# Patient Record
Sex: Male | Born: 1960 | Race: White | Hispanic: No | Marital: Married | Smoking: Never smoker
Health system: Southern US, Community
[De-identification: ages and names within clinical notes are randomized; demographics above are authoritative.]

## PROBLEM LIST (undated history)

## (undated) DIAGNOSIS — I10 Essential (primary) hypertension: Secondary | ICD-10-CM

## (undated) DIAGNOSIS — E119 Type 2 diabetes mellitus without complications: Secondary | ICD-10-CM

---

## 2006-01-02 ENCOUNTER — Ambulatory Visit: Payer: Self-pay | Admitting: Internal Medicine

## 2006-09-30 ENCOUNTER — Ambulatory Visit: Payer: Self-pay | Admitting: Otolaryngology

## 2007-12-11 ENCOUNTER — Ambulatory Visit (HOSPITAL_COMMUNITY): Admission: RE | Admit: 2007-12-11 | Discharge: 2007-12-11 | Payer: Self-pay | Admitting: Neurosurgery

## 2008-01-13 ENCOUNTER — Inpatient Hospital Stay (HOSPITAL_COMMUNITY): Admission: RE | Admit: 2008-01-13 | Discharge: 2008-01-15 | Payer: Self-pay | Admitting: Neurosurgery

## 2008-03-15 ENCOUNTER — Ambulatory Visit: Payer: Self-pay | Admitting: Neurosurgery

## 2010-06-19 NOTE — Op Note (Signed)
Brandon Sweeney, Brandon Sweeney NO.:  [id number]   MEDICAL RECORD NO.:  [id number]          PATIENT TYPE:  INP   LOCATION:  3032                         FACILITY:  [facility]   PHYSICIAN:  Hilda Lias, M.D.   DATE OF BIRTH:  1960-02-24   DATE OF PROCEDURE:  01/13/2008  DATE OF DISCHARGE:                               OPERATIVE REPORT   PREOPERATIVE DIAGNOSIS:  Cervical spondylosis with chronic  radiculopathy, C4-5, C5-6, and C6-7.   POSTOPERATIVE DIAGNOSIS:  Cervical spondylosis with chronic  radiculopathy, C4-5, C5-6, and C6-7.   PROCEDURE:  Anterior C4-5, C5-6, and C6-7 diskectomy, decompression of  spinal cord, bilateral foraminotomy, interbody fusion with auto and  allograft plate, and microscope.   SURGEON:  Hilda Lias, MD   CLINICAL HISTORY:  The patient is a 50 year old gentleman who had been  followed for many years complaining of neck pain with radiation to both  upper extremities, left worse than right one.  The patient came to my  office telling me that he is getting worse.  He has burning pain and  weakness in the deltoid, biceps, and triceps.  X-ray and myelogram  showed severe degenerative disk disease at the level of C4-5, C5-6, and  C6-7.  Surgery was advised.   PROCEDURE:  The patient was taken to the OR and after intubation, the  neck was cleaned with DuraPrep.  A transverse incision was made through  the skin, subcutaneous tissue, and platysma down to cervical area.  X-  rays showed that the needle was at the level of C4-5.  Immediately, we  found that he has a large osteophyte at the level of C4-5. C5-6, and C6-  7.  With the Leksell, we removed the osteophyte.  With __________  incision at the level of C4-5 with the anterior ligament was opened.  The patient has no space and we had to work our way down to the  posterior vertebral bodies using the microcurettes and drill.  Then, the  posterior ligament was opened.  Using the 1, 2, and 3-mm  Kerrison punch,  we decompressed the spinal cord as well as the foramen.  Both foramen  were quite narrow.  At the level of C5-6, it was the same finding.  The  disk was removed using the drill and the microcurette.  There was almost  no space between the posterior ligament and the dura mater.  At the end,  we worked our way and we found the space.  The patient had severe  foraminal stenosis and probably this was the worst level, left worse  than right one.  At the end, good decompression of C4 through C6 nerve  root was done.  At the level C6-7, we found the same finding with  decompression of spinal cord and the foramen.  Then, the endplates were  drilled.  Three allograft with autograft inside as well as DBX of 8 mm,  lordotic were inserted at those 3 levels.  This was followed with a  plate using 4 screws.  Lateral cervical spine showed that the plate was  in  good position at level C4-5 and from then below we were unable to see  it because of the shoulder.  Nevertheless, looking at the plate, we are  in good location.  Although, we achieved hemostasis, nevertheless, we  left a drain.  The wound was closed with Vicryl and Steri-Strips.           ______________________________  Hilda Lias, M.D.     EB/MEDQ  D:  01/13/2008  T:  01/14/2008  Job:  [id number]

## 2010-06-19 NOTE — Op Note (Signed)
Brandon Sweeney, EWING NO.:  [id number]   MEDICAL RECORD NO.:  [id number]          PATIENT TYPE:  INP   LOCATION:  3032                         FACILITY:  [facility]   PHYSICIAN:  Hilda Lias, M.D.   DATE OF BIRTH:  10/25/1960   DATE OF PROCEDURE:  01/13/2008  DATE OF DISCHARGE:                               OPERATIVE REPORT   PREOPERATIVE DIAGNOSIS:  C4-5, 5-6, 6-7 spondylosis with chronic  radiculopathy.   POSTOPERATIVE DIAGNOSIS:  C4-5, 5-6, 6-7 spondylosis with chronic  radiculopathy.   PROCEDURE:  Anterior 4-5, 5-6, 6-7 decompression of the spinal cord,  interbody fusion with allograft and DBX.  Plate, microscope.   SURGEON:  Hilda Lias, MD   CLINICAL HISTORY:  The patient is a 50 year old gentleman who had been  seen by me for more than 4 years complaining of neck pain with radiation  to the both upper extremities, left worse than right one.  X-ray and  myelogram showed severe spondylosis at the 4-5, 5-6, 6-7.  The patient  wanted to go ahead with surgery.  The risks were explained to him in the  history and physical.   PROCEDURE:  The patient was taken to the OR, and after intubation, the  left neck was cleaned with DuraPrep.  Transverse incision through the  skin, platysma was done.  We found 3 large osteophytes.  X-rays showed  that we were at the level of 4-5.  From then on, we removed the large  osteophyte at 4-5, 5-6, 6-7.  We started our dissection at the level of  4-5 where there was almost no space.  We worked our way posteriorly  using the microcurette and the drill.  Then, the posterior ligament was  opened.  Decompression of both C5 nerve roots was achieved.  At the  level of 5-6, we find the same findings.  Using the drill and the  microcurette to remove the disk, which was quite degenerative.  The  posterior ligament was quite thin and adherent to the dura mater.  Dissection was done, and at the end, we were able to decompress the  spinal cord in both C6 nerve roots.  This was probably the worse space.  At the level C6-C7, we find the same finding.  We then removal of that  degenerative disk and decompression of the spinal cord as well as the  nerve root.  Then, the endplate was drilled.  Three allograft of 8 mm  with autograft and DBX inside was used to fill out the space.  This was  followed by a plate using screws.  Lateral cervical spine showed good  position of the plate at the level of L4-5, and from below, it was  difficult to see because of the shoulder.  Nevertheless, the area on the  visualization showed normal position of the plate and the graft.  Although we achieved hemostasis, we left a drain in the precervical  area.  The wound was closed with Vicryl and Steri-Strips.           ______________________________  Hilda Lias, M.D.  EB/MEDQ  D:  01/13/2008  T:  01/14/2008  Job:  [id number]

## 2010-06-19 NOTE — H&P (Signed)
[name], Brandon Sweeney NO.:  [id number]   MEDICAL RECORD NO.:  [id number]          PATIENT TYPE:  INP   LOCATION:  3032                         FACILITY:  [facility]   PHYSICIAN:  Hilda Lias, M.D.   DATE OF BIRTH:  [birth year]   DATE OF ADMISSION:  01/13/2008  DATE OF DISCHARGE:                              HISTORY & PHYSICAL   HISTORY:  Mr. Brandon Sweeney is a gentleman who had been seen by me since 2006  complaining of neck pain radiating to the shoulder with a tingling  sensation going to the left upper extremity.  Five years ago, I made the  diagnosis of clinical cervical spondylosis.  He came back to see me  about a month ago complaining of worsening pain with pain mostly going  to the left upper extremity.  He has failed and he is not getting any  better.  We went ahead and we did a cervical myelogram which showed  spondylosis at C4-C5, C5-C6, and C6-C7.  Because of the finding, he  wants to proceed with surgery.   PAST MEDICAL HISTORY:  Negative.   ALLERGIES:  He is not allergic to medication.   SOCIAL HISTORY:  Negative.   FAMILY HISTORY:  I did surgery on his brother many years ago.   REVIEW OF SYSTEMS:  Positive for neck and left shoulder pain.   PHYSICAL EXAMINATION:  HEAD, EARS, NOSE, AND THROAT:  Normal.  NECK:  He has some limitation of movement secondary to pain.  LUNGS:  Clear.  HEART:  Heart sounds normal.  ABDOMEN:  Normal.  EXTREMITIES:  Normal pulses.  NEUROLOGIC:  He has some weakness of the biceps and deltoid.  Reflexes  are symmetrical.  He has and numbness which involves mostly the index  and middle finger.   Cervical spine x-ray shows spondylosis at C4-C5, C5-C6, and C6-C7 with  foraminal narrowing.   CLINICAL IMPRESSION:  Cervical spondylosis, C4-C5, C5-C6, and C6-C7 with  chronic radiculopathy.   RECOMMENDATIONS:  The patient is being admitted for surgical.  Procedure  will be a three-level anterior cervical diskectomy with bone graft  and  plate.  The patient knows about the risks of infection, CSF leak,  worsening pain, paralysis, no improvement whatsoever, and need for  another surgery.          ______________________________  Hilda Lias, M.D.    EB/MEDQ  D:  01/13/2008  T:  01/13/2008  Job:  [id number]

## 2010-11-09 LAB — CBC: MCV: 90 fL (ref 78.0–100.0)

## 2012-03-27 ENCOUNTER — Ambulatory Visit: Payer: Self-pay | Admitting: Unknown Physician Specialty

## 2016-06-20 ENCOUNTER — Encounter
Admission: EM | Disposition: A | Discharge status: Home / Self Care | Payer: Self-pay | Source: Home / Self Care | Attending: Emergency Medicine

## 2016-06-20 ENCOUNTER — Emergency Department
Admission: EM | Admit: 2016-06-20 | Discharge: 2016-06-20 | Disposition: A | Discharge status: Home / Self Care | Payer: BLUE CROSS/BLUE SHIELD | Attending: Emergency Medicine | Admitting: Emergency Medicine

## 2016-06-20 ENCOUNTER — Emergency Department: Payer: BLUE CROSS/BLUE SHIELD | Admitting: Certified Registered Nurse Anesthetist

## 2016-06-20 ENCOUNTER — Encounter: Payer: Self-pay | Admitting: Emergency Medicine

## 2016-06-20 ENCOUNTER — Emergency Department: Payer: BLUE CROSS/BLUE SHIELD

## 2016-06-20 DIAGNOSIS — S1184XA Puncture wound with foreign body of other specified part of neck, initial encounter: Secondary | ICD-10-CM | POA: Insufficient documentation

## 2016-06-20 DIAGNOSIS — I10 Essential (primary) hypertension: Secondary | ICD-10-CM | POA: Insufficient documentation

## 2016-06-20 DIAGNOSIS — Y929 Unspecified place or not applicable: Secondary | ICD-10-CM | POA: Diagnosis not present

## 2016-06-20 DIAGNOSIS — E119 Type 2 diabetes mellitus without complications: Secondary | ICD-10-CM | POA: Insufficient documentation

## 2016-06-20 DIAGNOSIS — W320XXA Accidental handgun discharge, initial encounter: Secondary | ICD-10-CM | POA: Insufficient documentation

## 2016-06-20 DIAGNOSIS — S1980XA Other specified injuries of unspecified part of neck, initial encounter: Secondary | ICD-10-CM | POA: Diagnosis present

## 2016-06-20 DIAGNOSIS — W3400XA Accidental discharge from unspecified firearms or gun, initial encounter: Secondary | ICD-10-CM

## 2016-06-20 HISTORY — DX: Essential (primary) hypertension: I10

## 2016-06-20 HISTORY — PX: EXCISION MASS NECK: SHX6703

## 2016-06-20 HISTORY — DX: Type 2 diabetes mellitus without complications: E11.9

## 2016-06-20 LAB — CBC WITH DIFFERENTIAL/PLATELET
Basophils Absolute: 0 10*3/uL (ref 0–0.1)
Basophils Relative: 1 %
EOS ABS: 0.1 10*3/uL (ref 0–0.7)
Eosinophils Relative: 1 %
HEMATOCRIT: 44.3 % (ref 40.0–52.0)
HEMOGLOBIN: 15.2 g/dL (ref 13.0–18.0)
LYMPHS ABS: 1.3 10*3/uL (ref 1.0–3.6)
Lymphocytes Relative: 21 %
MCH: 30 pg (ref 26.0–34.0)
MCHC: 34.4 g/dL (ref 32.0–36.0)
MCV: 87.3 fL (ref 80.0–100.0)
MONOS PCT: 9 %
Monocytes Absolute: 0.6 10*3/uL (ref 0.2–1.0)
NEUTROS PCT: 68 %
Neutro Abs: 4.4 10*3/uL (ref 1.4–6.5)
Platelets: 207 10*3/uL (ref 150–440)
RBC: 5.07 MIL/uL (ref 4.40–5.90)
RDW: 12.8 % (ref 11.5–14.5)
WBC: 6.4 10*3/uL (ref 3.8–10.6)

## 2016-06-20 LAB — BASIC METABOLIC PANEL
ANION GAP: 8 (ref 5–15)
BUN: 20 mg/dL (ref 6–20)
CO2: 26 mmol/L (ref 22–32)
Calcium: 8.9 mg/dL (ref 8.9–10.3)
Chloride: 101 mmol/L (ref 101–111)
Creatinine, Ser: 0.89 mg/dL (ref 0.61–1.24)
Glucose, Bld: 142 mg/dL — ABNORMAL HIGH (ref 65–99)
POTASSIUM: 3.4 mmol/L — AB (ref 3.5–5.1)
SODIUM: 135 mmol/L (ref 135–145)

## 2016-06-20 LAB — TYPE AND SCREEN
ABO/RH(D): A POS
Antibody Screen: NEGATIVE

## 2016-06-20 LAB — GLUCOSE, CAPILLARY: Glucose-Capillary: 136 mg/dL — ABNORMAL HIGH (ref 65–99)

## 2016-06-20 LAB — PROTIME-INR
INR: 0.96
Prothrombin Time: 12.8 seconds (ref 11.4–15.2)

## 2016-06-20 SURGERY — EXCISION, MASS, NECK
Anesthesia: General

## 2016-06-20 MED ORDER — BACITRACIN 500 UNIT/GM EX OINT
TOPICAL_OINTMENT | CUTANEOUS | Status: DC | PRN
  Administered 2016-06-20: 1 via TOPICAL

## 2016-06-20 MED ORDER — FENTANYL CITRATE (PF) 100 MCG/2ML IJ SOLN
INTRAMUSCULAR | Status: AC
  Filled 2016-06-20: qty 2

## 2016-06-20 MED ORDER — FENTANYL CITRATE (PF) 100 MCG/2ML IJ SOLN
INTRAMUSCULAR | Status: DC | PRN
  Administered 2016-06-20 (×2): 50 ug via INTRAVENOUS

## 2016-06-20 MED ORDER — PHENYLEPHRINE HCL 10 MG/ML IJ SOLN
INTRAMUSCULAR | Status: DC | PRN
  Administered 2016-06-20 (×3): 100 ug via INTRAVENOUS

## 2016-06-20 MED ORDER — GLYCOPYRROLATE 0.2 MG/ML IJ SOLN
INTRAMUSCULAR | Status: DC | PRN
  Administered 2016-06-20: 0.2 mg via INTRAVENOUS

## 2016-06-20 MED ORDER — OXYCODONE-ACETAMINOPHEN 5-325 MG PO TABS
1.0000 | ORAL_TABLET | Freq: Once | ORAL | Status: AC
  Administered 2016-06-20: 1 via ORAL
  Filled 2016-06-20: qty 1

## 2016-06-20 MED ORDER — MIDAZOLAM HCL 2 MG/2ML IJ SOLN
INTRAMUSCULAR | Status: AC
  Filled 2016-06-20: qty 2

## 2016-06-20 MED ORDER — CEFAZOLIN SODIUM-DEXTROSE 1-4 GM/50ML-% IV SOLN
1.0000 g | Freq: Once | INTRAVENOUS | Status: AC
  Administered 2016-06-20: 1 g via INTRAVENOUS
  Filled 2016-06-20: qty 50

## 2016-06-20 MED ORDER — FENTANYL CITRATE (PF) 100 MCG/2ML IJ SOLN: 25.0000 ug | INTRAMUSCULAR | Status: DC | PRN

## 2016-06-20 MED ORDER — ONDANSETRON HCL 4 MG/2ML IJ SOLN: 4.0000 mg | Freq: Once | INTRAMUSCULAR | Status: DC | PRN

## 2016-06-20 MED ORDER — ONDANSETRON HCL 4 MG/2ML IJ SOLN
INTRAMUSCULAR | Status: DC | PRN
  Administered 2016-06-20: 4 mg via INTRAVENOUS

## 2016-06-20 MED ORDER — ACETAMINOPHEN 10 MG/ML IV SOLN
INTRAVENOUS | Status: AC
  Filled 2016-06-20: qty 100

## 2016-06-20 MED ORDER — DEXAMETHASONE SODIUM PHOSPHATE 10 MG/ML IJ SOLN
INTRAMUSCULAR | Status: DC | PRN
  Administered 2016-06-20: 10 mg via INTRAVENOUS

## 2016-06-20 MED ORDER — SUCCINYLCHOLINE CHLORIDE 20 MG/ML IJ SOLN
INTRAMUSCULAR | Status: DC | PRN
  Administered 2016-06-20: 130 mg via INTRAVENOUS

## 2016-06-20 MED ORDER — GENTAMICIN SULFATE 0.1 % EX OINT: 1.0000 "application " | TOPICAL_OINTMENT | Freq: Three times a day (TID) | CUTANEOUS | 0 refills | Status: AC

## 2016-06-20 MED ORDER — PROPOFOL 10 MG/ML IV BOLUS
INTRAVENOUS | Status: DC | PRN
  Administered 2016-06-20: 50 mg via INTRAVENOUS
  Administered 2016-06-20: 150 mg via INTRAVENOUS

## 2016-06-20 MED ORDER — IOPAMIDOL (ISOVUE-370) INJECTION 76%
75.0000 mL | Freq: Once | INTRAVENOUS | Status: AC | PRN
  Administered 2016-06-20: 75 mL via INTRAVENOUS

## 2016-06-20 MED ORDER — MIDAZOLAM HCL 2 MG/2ML IJ SOLN
INTRAMUSCULAR | Status: DC | PRN
  Administered 2016-06-20: 2 mg via INTRAVENOUS

## 2016-06-20 MED ORDER — LIDOCAINE-EPINEPHRINE 1 %-1:100000 IJ SOLN
INTRAMUSCULAR | Status: DC | PRN
  Administered 2016-06-20: 7.5 mL

## 2016-06-20 MED ORDER — EPHEDRINE SULFATE 50 MG/ML IJ SOLN
INTRAMUSCULAR | Status: DC | PRN
  Administered 2016-06-20: 10 mg via INTRAVENOUS

## 2016-06-20 MED ORDER — PROPOFOL 10 MG/ML IV BOLUS
INTRAVENOUS | Status: AC
  Filled 2016-06-20: qty 20

## 2016-06-20 MED ORDER — LACTATED RINGERS IV SOLN
INTRAVENOUS | Status: DC | PRN
  Administered 2016-06-20: 18:00:00 via INTRAVENOUS

## 2016-06-20 MED ORDER — ACETAMINOPHEN 10 MG/ML IV SOLN
INTRAVENOUS | Status: DC | PRN
  Administered 2016-06-20: 1000 mg via INTRAVENOUS

## 2016-06-20 MED ORDER — LIDOCAINE HCL (PF) 2 % IJ SOLN
INTRAMUSCULAR | Status: AC
  Filled 2016-06-20: qty 2

## 2016-06-20 MED ORDER — AMOXICILLIN-POT CLAVULANATE 875-125 MG PO TABS: 1.0000 | ORAL_TABLET | Freq: Two times a day (BID) | ORAL | 0 refills | Status: AC

## 2016-06-20 MED ORDER — OXYCODONE-ACETAMINOPHEN 5-325 MG PO TABS: 1.0000 | ORAL_TABLET | ORAL | 0 refills | Status: AC | PRN

## 2016-06-20 MED ORDER — ONDANSETRON HCL 4 MG PO TABS: 4.0000 mg | ORAL_TABLET | Freq: Three times a day (TID) | ORAL | 0 refills | Status: AC | PRN

## 2016-06-20 MED ORDER — LIDOCAINE HCL (CARDIAC) 20 MG/ML IV SOLN
INTRAVENOUS | Status: DC | PRN
  Administered 2016-06-20: 100 mg via INTRAVENOUS

## 2016-06-20 MED ORDER — LABETALOL HCL 5 MG/ML IV SOLN
INTRAVENOUS | Status: DC | PRN
  Administered 2016-06-20: 5 mg via INTRAVENOUS

## 2016-06-20 SURGICAL SUPPLY — 37 items
BLADE SURG 15 STRL LF DISP TIS (BLADE) ×1 IMPLANT
BLADE SURG 15 STRL SS (BLADE) ×2
CANISTER SUCT 1200ML W/VALVE (MISCELLANEOUS) ×3 IMPLANT
CLOSURE WOUND 1/4X4 (GAUZE/BANDAGES/DRESSINGS)
CORD BIP STRL DISP 12FT (MISCELLANEOUS) ×3 IMPLANT
DERMABOND ADVANCED (GAUZE/BANDAGES/DRESSINGS)
DERMABOND ADVANCED .7 DNX12 (GAUZE/BANDAGES/DRESSINGS) IMPLANT
DRAPE MAG INST 16X20 L/F (DRAPES) ×3 IMPLANT
DRSG TEGADERM 2-3/8X2-3/4 SM (GAUZE/BANDAGES/DRESSINGS) ×3 IMPLANT
ELECT NEEDLE 20X.3 GREEN (MISCELLANEOUS) ×3
ELECT REM PT RETURN 9FT ADLT (ELECTROSURGICAL) ×3
ELECTRODE NEEDLE 20X.3 GREEN (MISCELLANEOUS) ×1 IMPLANT
ELECTRODE REM PT RTRN 9FT ADLT (ELECTROSURGICAL) ×1 IMPLANT
FORCEPS JEWEL BIP 4-3/4 STR (INSTRUMENTS) ×3 IMPLANT
GLOVE BIO SURGEON STRL SZ7.5 (GLOVE) ×6 IMPLANT
GOWN STRL REUS W/ TWL LRG LVL3 (GOWN DISPOSABLE) ×3 IMPLANT
GOWN STRL REUS W/TWL LRG LVL3 (GOWN DISPOSABLE) ×6
HOOK STAY BLUNT/RETRACTOR 5M (MISCELLANEOUS) ×3 IMPLANT
JACKSON PRATT 10 (INSTRUMENTS) IMPLANT
JACKSON PRATT 7MM (INSTRUMENTS) ×3 IMPLANT
KIT RM TURNOVER STRD PROC AR (KITS) ×3 IMPLANT
LABEL OR SOLS (LABEL) ×3 IMPLANT
NS IRRIG 500ML POUR BTL (IV SOLUTION) ×3 IMPLANT
PACK HEAD/NECK (MISCELLANEOUS) ×3 IMPLANT
PROBE MONO 100X0.75 ELECT 1.9M (MISCELLANEOUS) ×3 IMPLANT
SHEARS HARMONIC 9CM CVD (BLADE) ×3 IMPLANT
SPONGE KITTNER 5P (MISCELLANEOUS) ×3 IMPLANT
SPONGE LAP 18X18 5 PK (GAUZE/BANDAGES/DRESSINGS) IMPLANT
SPONGE XRAY 4X4 16PLY STRL (MISCELLANEOUS) IMPLANT
STRIP CLOSURE SKIN 1/4X4 (GAUZE/BANDAGES/DRESSINGS) IMPLANT
SUT PROLENE 6 0 P 1 18 (SUTURE) IMPLANT
SUT SILK 2 0 (SUTURE) ×2
SUT SILK 2 0 SH (SUTURE) ×3 IMPLANT
SUT SILK 2 0SH CR/8 30 (SUTURE) IMPLANT
SUT SILK 2-0 18XBRD TIE 12 (SUTURE) ×1 IMPLANT
SUT VIC AB 4-0 RB1 18 (SUTURE) ×3 IMPLANT
SYR 3ML LL SCALE MARK (SYRINGE) ×3 IMPLANT

## 2016-06-20 NOTE — Anesthesia Post-op Follow-up Note (Cosign Needed)
Anesthesia QCDR form completed.        
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
## 2016-06-20 NOTE — Transfer of Care (Signed)
Immediate Anesthesia Transfer of Care Note  Patient: Brandon Sweeney  Procedure(s) Performed: Procedure(s): EXCISION MASS NECK (N/A)  Patient Location: PACU  Anesthesia Type:General  Level of Consciousness: awake and alert   Airway & Oxygen Therapy: Patient Spontanous Breathing and Patient connected to face mask oxygen  Post-op Assessment: Report given to RN and Post -op Vital signs reviewed and stable  Post vital signs: Reviewed and stable  Last Vitals:  Vitals:   06/20/16 1645 06/20/16 1654  BP: (!) 166/104   Pulse: 94 94  Resp: 16 17  Temp:      Last Pain:  Vitals:   06/20/16 1453  TempSrc: Oral  PainSc: 8          Complications: No apparent anesthesia complications
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
## 2016-06-20 NOTE — Anesthesia Procedure Notes (Signed)
Procedure Name: Intubation Performed by: Demetrius Charity Pre-anesthesia Checklist: Patient identified, Patient being monitored, Timeout performed, Emergency Drugs available and Suction available Patient Re-evaluated:Patient Re-evaluated prior to inductionOxygen Delivery Method: Circle system utilized Preoxygenation: Pre-oxygenation with 100% oxygen Intubation Type: IV induction and Rapid sequence Laryngoscope Size: Glidescope and 4 Grade View: Grade II Tube type: Oral Tube size: 7.5 mm Number of attempts: 1 Airway Equipment and Method: Video-laryngoscopy and Rigid stylet Placement Confirmation: ETT inserted through vocal cords under direct vision,  positive ETCO2 and breath sounds checked- equal and bilateral Secured at: 24 cm Tube secured with: Tape Dental Injury: Teeth and Oropharynx as per pre-operative assessment

## 2016-06-20 NOTE — Anesthesia Preprocedure Evaluation (Signed)
Anesthesia Evaluation  Patient identified by MRN, date of birth, ID band Patient awake    Reviewed: Allergy & Precautions, H&P , NPO status , Patient's Chart, lab work & pertinent test results, reviewed documented beta blocker date and time   Airway Mallampati: III  TM Distance: <3 FB Neck ROM: full  Mouth opening: Limited Mouth Opening  Dental  (+) Teeth Intact   Pulmonary neg pulmonary ROS,    Pulmonary exam normal        Cardiovascular hypertension, On Medications negative cardio ROS Normal cardiovascular exam Rhythm:regular Rate:Normal     Neuro/Psych negative neurological ROS  negative psych ROS   GI/Hepatic negative GI ROS, Neg liver ROS,   Endo/Other  negative endocrine ROSdiabetes, Well Controlled  Renal/GU negative Renal ROS  negative genitourinary   Musculoskeletal   Abdominal   Peds  Hematology negative hematology ROS (+)   Anesthesia Other Findings Past Medical History: No date: Diabetes mellitus without complication ([facility]) No date: Hypertension No past surgical history on file. BMI    Body Mass Index:  34.03 kg/m     Reproductive/Obstetrics negative OB ROS                             Anesthesia Physical Anesthesia Plan  ASA: II and emergent  Anesthesia Plan: General ETT   Post-op Pain Management:    Induction:   Airway Management Planned: Video Laryngoscope Planned  Additional Equipment:   Intra-op Plan:   Post-operative Plan: Possible Post-op intubation/ventilation  Informed Consent: I have reviewed the patients History and Physical, chart, labs and discussed the procedure including the risks, benefits and alternatives for the proposed anesthesia with the patient or authorized representative who has indicated his/her understanding and acceptance.   Dental Advisory Given  Plan Discussed with: CRNA  Anesthesia Plan Comments:         Anesthesia Quick  Evaluation

## 2016-06-20 NOTE — ED Notes (Signed)
ENT surg at bedside
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
## 2016-06-21 ENCOUNTER — Encounter: Payer: Self-pay | Admitting: Otolaryngology

## 2016-06-24 LAB — SURGICAL PATHOLOGY

## 2016-06-24 NOTE — Anesthesia Postprocedure Evaluation (Signed)
Anesthesia Post Note  Patient: Brandon Sweeney  Procedure(s) Performed: Procedure(s) (LRB): EXCISION MASS NECK (N/A)  Patient location during evaluation: PACU Anesthesia Type: General Level of consciousness: awake and alert Pain management: pain level controlled Vital Signs Assessment: post-procedure vital signs reviewed and stable Respiratory status: spontaneous breathing, nonlabored ventilation, respiratory function stable and patient connected to nasal cannula oxygen Cardiovascular status: blood pressure returned to baseline and stable Postop Assessment: no signs of nausea or vomiting Anesthetic complications: no     Last Vitals:  Vitals:   06/20/16 1945 06/20/16 1955  BP: (!) 159/104 (!) 154/86  Pulse: 89 92  Resp: 18   Temp:      Last Pain:  Vitals:   06/20/16 1850  TempSrc:   PainSc: [place] Juri Dinning

## 2017-03-06 ENCOUNTER — Other Ambulatory Visit: Payer: Self-pay | Admitting: Orthopedic Surgery

## 2017-03-06 ENCOUNTER — Ambulatory Visit
Admission: RE | Admit: 2017-03-06 | Discharge: 2017-03-06 | Disposition: A | Discharge status: Home / Self Care | Payer: BLUE CROSS/BLUE SHIELD | Source: Ambulatory Visit | Attending: Orthopedic Surgery | Admitting: Orthopedic Surgery

## 2017-03-06 DIAGNOSIS — Z1389 Encounter for screening for other disorder: Secondary | ICD-10-CM

## 2017-03-06 DIAGNOSIS — M795 Residual foreign body in soft tissue: Secondary | ICD-10-CM | POA: Diagnosis present

## 2017-03-06 DIAGNOSIS — Z0189 Encounter for other specified special examinations: Secondary | ICD-10-CM

## 2017-03-10 ENCOUNTER — Other Ambulatory Visit: Payer: Self-pay | Admitting: Orthopedic Surgery

## 2017-03-10 DIAGNOSIS — G8929 Other chronic pain: Secondary | ICD-10-CM

## 2017-03-10 DIAGNOSIS — M545 Low back pain: Principal | ICD-10-CM

## 2017-03-14 ENCOUNTER — Other Ambulatory Visit: Payer: Self-pay | Admitting: Otolaryngology

## 2017-03-14 DIAGNOSIS — W320XXD Accidental handgun discharge, subsequent encounter: Secondary | ICD-10-CM

## 2017-03-17 ENCOUNTER — Ambulatory Visit
Admission: RE | Admit: 2017-03-17 | Discharge: 2017-03-17 | Disposition: A | Discharge status: Home / Self Care | Payer: BLUE CROSS/BLUE SHIELD | Source: Ambulatory Visit | Attending: Otolaryngology | Admitting: Otolaryngology

## 2017-03-17 DIAGNOSIS — M795 Residual foreign body in soft tissue: Secondary | ICD-10-CM | POA: Insufficient documentation

## 2017-03-17 DIAGNOSIS — W320XXD Accidental handgun discharge, subsequent encounter: Secondary | ICD-10-CM | POA: Insufficient documentation

## 2017-03-17 DIAGNOSIS — T148XXD Other injury of unspecified body region, subsequent encounter: Secondary | ICD-10-CM | POA: Diagnosis present

## 2017-03-17 MED ORDER — IOPAMIDOL (ISOVUE-300) INJECTION 61%
75.0000 mL | Freq: Once | INTRAVENOUS | Status: AC | PRN
  Administered 2017-03-17: 75 mL via INTRAVENOUS

## 2017-03-19 ENCOUNTER — Other Ambulatory Visit: Payer: BLUE CROSS/BLUE SHIELD

## 2017-03-21 ENCOUNTER — Ambulatory Visit
Admission: RE | Admit: 2017-03-21 | Discharge: 2017-03-21 | Disposition: A | Discharge status: Home / Self Care | Payer: BLUE CROSS/BLUE SHIELD | Source: Ambulatory Visit | Attending: Orthopedic Surgery | Admitting: Orthopedic Surgery

## 2017-03-21 DIAGNOSIS — G8929 Other chronic pain: Secondary | ICD-10-CM

## 2017-03-21 DIAGNOSIS — M545 Low back pain: Principal | ICD-10-CM

## 2017-03-21 MED ORDER — IOPAMIDOL (ISOVUE-M 200) INJECTION 41%
15.0000 mL | Freq: Once | INTRAMUSCULAR | Status: AC
  Administered 2017-03-21: 15 mL via INTRATHECAL

## 2017-03-21 MED ORDER — DIAZEPAM 5 MG PO TABS
10.0000 mg | ORAL_TABLET | Freq: Once | ORAL | Status: AC
  Administered 2017-03-21: 10 mg via ORAL

## 2017-03-21 NOTE — Discharge Instructions (Signed)

## 2017-04-11 ENCOUNTER — Ambulatory Visit
Admission: RE | Admit: 2017-04-11 | Discharge: 2017-04-11 | Disposition: A | Discharge status: Home / Self Care | Payer: BLUE CROSS/BLUE SHIELD | Source: Ambulatory Visit | Attending: Internal Medicine | Admitting: Internal Medicine

## 2017-04-11 DIAGNOSIS — R002 Palpitations: Secondary | ICD-10-CM | POA: Diagnosis not present

## 2017-04-11 DIAGNOSIS — R9431 Abnormal electrocardiogram [ECG] [EKG]: Secondary | ICD-10-CM | POA: Diagnosis not present

## 2017-04-11 DIAGNOSIS — I10 Essential (primary) hypertension: Secondary | ICD-10-CM | POA: Insufficient documentation

## 2017-04-30 HISTORY — PX: BACK SURGERY: SHX140

## 2017-07-16 ENCOUNTER — Other Ambulatory Visit: Payer: Self-pay

## 2017-07-16 ENCOUNTER — Ambulatory Visit: Payer: BLUE CROSS/BLUE SHIELD | Attending: Neurosurgery | Admitting: Physical Therapy

## 2017-07-16 ENCOUNTER — Encounter: Payer: Self-pay | Admitting: Physical Therapy

## 2017-07-16 DIAGNOSIS — M62830 Muscle spasm of back: Secondary | ICD-10-CM | POA: Insufficient documentation

## 2017-07-16 DIAGNOSIS — M6281 Muscle weakness (generalized): Secondary | ICD-10-CM | POA: Insufficient documentation

## 2017-07-16 DIAGNOSIS — M545 Low back pain, unspecified: Secondary | ICD-10-CM

## 2017-07-16 NOTE — Therapy (Signed)
[facility] [address], [place], [address] Phone: [phone]   Fax:  [phone]  Physical Therapy Treatment  Patient Details  Name: Brandon Sweeney MRN: [id number] Date of Birth: 1960/08/31 Referring Provider: Sherwood Gambler   Encounter Date: 07/16/2017    Past Medical History:  Diagnosis Date  . Hypertension     Past Surgical History:  Procedure Laterality Date  . EXCISION MASS NECK N/A 06/20/2016   Procedure: EXCISION MASS NECK;  Surgeon: Carloyn Manner, MD;  Location: [facility] ORS;  Service: ENT;  Laterality: N/A;    There were no vitals filed for this visit.  Subjective Assessment - 07/16/17 1632    Subjective  Patient reports he is steadily improving with back since surgery and is currently at a local gym doing elliptical 2-3 miles 1-2x/week.     Pertinent History  Patient reports initially having left hip pain with walking since 11/2016 and then sought medical evaluation 01/2017 and was treated for hip pain without results and then had MRI of lumbar spine with bulging discs with impingement and underwent surgery 04/30/2017. He has been out of work since 05/01/2107. He has been recuperating from surgery and having regular check ups up until 06/05/2017 and then tried to do a jumping jack and felt increased tenderness in lower back. He is still out of work due to back still healing.     Limitations  Lifting;Walking;Standing;House hold activities;Other (comment);Sitting standing forklift operator/trainer    How long can you sit comfortably?  >1 hour    How long can you stand comfortably?  up to an hour    How long can you walk comfortably?  >1hour    Patient Stated Goals  to be able to do more active exercises with jumping, jarring movements and be able to ride a lawn mower    Currently in Pain?  No/denies best 0/10 and worst up to 4/10 with jarring movements         [facility] PT Assessment - 07/16/17 1644      Assessment   Medical Diagnosis  back pain    Referring Provider  [place]    Onset Date/Surgical Date  04/30/17    Hand Dominance  Right    Prior Therapy  none      Precautions   Precautions  None      Balance Screen   Has the patient fallen in the past 6 months  No      [place] residence    Living Arrangements  Spouse/significant other    Type of [place] to enter    Entrance Stairs-Number of Steps  4    Entrance Stairs-Rails  [place]  One level      Prior Function   Level of [place]  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     Problem List There are no active problems to display for this patient.   Aldona Lento 07/16/2017, 4:54 PM  [facility] [address] [place], [address] Phone: [phone]   Fax:  [phone]  Name: Brandon Sweeney MRN: [id number] Date of Birth: 1960/04/09
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
## 2017-07-22 ENCOUNTER — Encounter: Payer: Self-pay | Admitting: Physical Therapy

## 2017-07-22 ENCOUNTER — Ambulatory Visit: Payer: BLUE CROSS/BLUE SHIELD | Admitting: Physical Therapy

## 2017-07-22 DIAGNOSIS — M6281 Muscle weakness (generalized): Secondary | ICD-10-CM

## 2017-07-22 DIAGNOSIS — M545 Low back pain, unspecified: Secondary | ICD-10-CM

## 2017-07-22 NOTE — Therapy (Signed)
[facility] [address] [place], [address] Phone: [phone]   Fax:  [phone]  Physical Therapy Treatment  Patient Details  Name: CHRISTY FRIEDE MRN: [id number] Date of Birth: Aug 07, 1960 Referring Provider: Jovita Gamma MD   Encounter Date: 07/22/2017  PT End of Session - 07/22/17 0834    Visit Number  2    Number of Visits  12    Date for PT Re-Evaluation  08/27/17    PT Start Time  0825    PT Stop Time  0930    PT Time Calculation (min)  65 min    Activity Tolerance  Patient tolerated treatment well    Behavior During Therapy  [facility] for tasks assessed/performed       Past Medical History:  Diagnosis Date  . Hypertension     Past Surgical History:  Procedure Laterality Date  . EXCISION MASS NECK N/A 06/20/2016   Procedure: EXCISION MASS NECK;  Surgeon: Carloyn Manner, MD;  Location: [facility] ORS;  Service: ENT;  Laterality: N/A;    There were no vitals filed for this visit.  Subjective Assessment - 07/22/17 3151    Subjective  Patient reports he continues with back symptoms and is exercising as instructed at home.     Pertinent History  Patient reports initially having left hip pain with walking since 11/2016 and then sought medical evaluation 01/2017 and was treated for hip pain without results and then had MRI of lumbar spine with bulging discs with impingement and underwent surgery 04/30/2017. He has been out of work since 05/01/2107. He has been recuperating from surgery and having regular check ups up until 06/05/2017 and then tried to do a jumping jack and felt increased tenderness in lower back. He is still out of work due to back still healing.     Limitations  Lifting;Walking;Standing;House hold activities;Other (comment);Sitting standing forklift operator/trainer    How long can you sit comfortably?  >1 hour    How long can you stand comfortably?  up to an hour    How long can you walk  comfortably?  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  Yes    Pain Score  3     Pain Location  Back    Pain Orientation  Lower    Pain Descriptors / Indicators  Aching;Sore    Pain Onset  More than a month ago    Pain Frequency  Intermittent       Objective:  Therapeutic exercises: patient performed with demonstration, verbal cues and facilitation of therapist: goals: independent with home program, strength, pain  Supine: Hook lying Marching with core control x 10 each LE 5# raise overhead x 15  Bridging with ball between knees x 15 Bridging with resistive band hip abduction x 10 reps  Hamstring stretching 3 x 30 seconds each LE Glute/piriformis stretch each LE Hip flexor stretch with LE over edge of treatment table x 3 reps 30 seconds  Side lying each side: Clamshells with green resistive band around thighs 15 reps each LE  Prone lying:  Hip extension x 10 reps alternating LE's  Sitting: Hip adduction with ball squeeze x 15 Hip abduction with green resistive band supine and sitting x 15   Standing: 5#  weight over head x 15 5# weight rotations of trunk to each hip x 15 Side bend each right/left with limited motion to left due to pain: 5# weight x 10 Walk on diagonal holding (2) 5# weights in hands x 2 min  Instructed in calf stretches, hip flexor stretch in standing with chair, quad stretching side lying or standing  Patient response to treatment: patient demonstrated improved technique with exercises with minimal VC for correct alignment. Patient with mild increased pain to 4/10 with side bend left during exercises. Improved motor control with repetition and cuing         PT Education - 07/22/17 0921    Education Details  HEP added stretching for LE; core exercises supine, side lying and  sitting with handout    Person(s) Educated  Patient    Methods  Explanation;Demonstration;Verbal cues;Handout    Comprehension  Verbalized understanding;Returned demonstration;Verbal cues required          PT Long Term Goals - 07/16/17 1846      PT LONG TERM GOAL #1   Title  Patient will demonstrate improved function with daily activities with mild to no back symptoms as indicated by FOTO score of 72% or better and MODI score of <10%    Baseline  FOTO 62%; MODI 20%    Status  New    Target Date  08/27/17      PT LONG TERM GOAL #2   Title  Patient will be independent with home program for pain control, strength, flexibility to allow self management once discharged from physical therapy    Baseline  limited knowledge of appropriate exercises, pain control strategies without instruction, cuing    Status  New    Target Date  08/27/17            Plan - 07/22/17 0933    Clinical Impression Statement  Patient is progressing with exercises for core control with demonsration and moderate cuing for correct technique and positioning. He will benefit from continued physical therapy intervention to further improve strength and endurance in order to return to prior level of function.     Rehab Potential  Good    PT Frequency  2x / week    PT Duration  6 weeks    PT Treatment/Interventions  Cryotherapy;Electrical Stimulation;Moist Heat;Therapeutic activities;Patient/family education;Neuromuscular re-education;Manual techniques;Taping;Dry needling;Therapeutic exercise    PT Next Visit Plan  progressive therapeutic exercise; planks on counter/wall, push ups off wall, counter, scapular retraction with resistive band and shoulder extension to hips; RS in sitting for trunk flexion/extension    PT Home Exercise Plan  body mechanics awareness, sitting core stabilization; supine, side lying, standing stabilization exercises      Patient will benefit from skilled therapeutic intervention in order  to improve the following deficits and impairments:  Improper body mechanics, Postural dysfunction, Impaired perceived functional ability, Decreased range of motion, Decreased activity tolerance, Decreased strength  Visit Diagnosis: Muscle weakness (generalized)  Bilateral low back pain without sciatica, unspecified chronicity     Problem List There are no active problems to display for this patient.   Jomarie Longs PT 07/22/2017, 4:47 PM  [facility] [address] [place], [address] Phone: [phone]   Fax:  [phone]  Name: MOIZ RYANT MRN: [id number] Date of Birth: [birth year]

## 2017-07-24 ENCOUNTER — Ambulatory Visit: Payer: BLUE CROSS/BLUE SHIELD | Admitting: Physical Therapy

## 2017-07-24 DIAGNOSIS — M545 Low back pain, unspecified: Secondary | ICD-10-CM

## 2017-07-24 DIAGNOSIS — M6281 Muscle weakness (generalized): Secondary | ICD-10-CM | POA: Diagnosis not present

## 2017-07-24 NOTE — Therapy (Signed)
[place] [facility] [address], [place], [address] Phone: [phone]   Fax:  [phone]  Physical Therapy Treatment  Patient Details  Name: Brandon Sweeney MRN: [id number] Date of Birth: 21-Nov-1960 Referring Provider: Jovita Gamma MD   Encounter Date: 07/24/2017  PT End of Session - 07/24/17 1606    Visit Number  3    Number of Visits  12    Date for PT Re-Evaluation  08/27/17    PT Start Time  1519    PT Stop Time  1601    PT Time Calculation (min)  42 min    Activity Tolerance  Patient tolerated treatment well    Behavior During Therapy  [facility] for tasks assessed/performed       Past Medical History:  Diagnosis Date  . Hypertension     Past Surgical History:  Procedure Laterality Date  . EXCISION MASS NECK N/A 06/20/2016   Procedure: EXCISION MASS NECK;  Surgeon: Carloyn Manner, MD;  Location: [facility] ORS;  Service: ENT;  Laterality: N/A;    There were no vitals filed for this visit.  Subjective Assessment - 07/24/17 1522    Subjective  Patient reports he continues with back symptoms and is exercising and continues with left sided pain.     Pertinent History  Patient reports initially having left hip pain with walking since 11/2016 and then sought medical evaluation 01/2017 and was treated for hip pain without results and then had MRI of lumbar spine with bulging discs with impingement and underwent surgery 04/30/2017. He has been out of work since 05/01/2107. He has been recuperating from surgery and having regular check ups up until 06/05/2017 and then tried to do a jumping jack and felt increased tenderness in lower back. He is still out of work due to back still healing.     Limitations  Lifting;Walking;Standing;House hold activities;Other (comment);Sitting standing forklift operator/trainer    How long can you sit comfortably?  >1 hour    How long can you stand comfortably?  up to an hour    How long can  you walk comfortably?  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  Yes    Pain Score  2     Pain Location  Back    Pain Orientation  Lower;Left    Pain Descriptors / Indicators  Spasm;Aching;Sore    Pain Onset  More than a month ago    Pain Frequency  Intermittent       Objective: AROM lumbar spine: side bend left with increased pain left iliac crest and just above ASIS    Treatment:  Therapeutic exercises: patient performed with demonstration, verbal cues and facilitation of therapist: goals: independent with home program, strength, pain     Side lying right side: Clamshells with manual resistance moderate intensity x 15 reps left LE   Standing: walk along balance stones while hold 10# weight in one hand; performed 10 sets, alternating hands and using opposite hand for balance hip rotary machine hip extension 70#, hip abduction 40# each LE  Modalities: Electrical stimulation: High volt estim.clincial program for muscle spasms  (2) electrodes applied to left side lower lumbar spine and around left pelvis with MHP x 15 min intensity to tolerance with patient in  right side lying with left LE supported on pillow goal: pain, spasms  Patient response to treatment: patient demonstrated improved technique with exercises with minimal VC for correct alignment. Patient with decreased pain from 2 /10 to  <1/10 with side bend left following modalities.  Improved motor control with repetition and cuing           PT Education - 07/24/17 1601    Education Details  instructed in use of hip rotary machine, balance stone walk, re assessed HEP, use of electrical stimulation for muscl spasms    Person(s) Educated  Patient    Methods  Explanation;Demonstration;Verbal cues    Comprehension  Verbalized understanding;Returned  demonstration;Verbal cues required          PT Long Term Goals - 07/16/17 1846      PT LONG TERM GOAL #1   Title  Patient will demonstrate improved function with daily activities with mild to no back symptoms as indicated by FOTO score of 72% or better and MODI score of <10%    Baseline  FOTO 62%; MODI 20%    Status  New    Target Date  08/27/17      PT LONG TERM GOAL #2   Title  Patient will be independent with home program for pain control, strength, flexibility to allow self management once discharged from physical therapy    Baseline  limited knowledge of appropriate exercises, pain control strategies without instruction, cuing    Status  New    Target Date  08/27/17            Plan - 07/24/17 1629    Clinical Impression Statement  Patient responded well to electrical stimulation with decreased pain left side and able to perform lumbar spine side bend left with significantly less pain. He is progressing well with exercises with minimal cuing for correct technique. He well benefit from continued physical therapy intervention to address pain and weakness in order to achieve goals.     Rehab Potential  Good    PT Frequency  2x / week    PT Duration  6 weeks    PT Treatment/Interventions  Cryotherapy;Electrical Stimulation;Moist Heat;Therapeutic activities;Patient/family education;Neuromuscular re-education;Manual techniques;Taping;Dry needling;Therapeutic exercise    PT Next Visit Plan  progressive therapeutic exercise; manual techniques, electrical stimulation, moist heat     PT Home Exercise Plan  body mechanics awareness, sitting core stabilization, LE stretching       Patient will benefit from skilled therapeutic intervention in order to improve the following deficits and impairments:  Improper body mechanics, Postural dysfunction, Impaired perceived functional ability, Decreased range of motion, Decreased activity tolerance, Decreased strength  Visit Diagnosis: Muscle  weakness (generalized)  Bilateral low back pain without sciatica, unspecified chronicity     Problem List There are no active problems to display for this patient.   Jomarie Longs PT 07/25/2017, 10:33 AM  [place] [facility] [address] [place], [address] Phone: [phone]   Fax:  [phone]  Name: Brandon Sweeney MRN: [id number] Date of Birth: [birth year]

## 2017-07-29 ENCOUNTER — Encounter: Payer: Self-pay | Admitting: Physical Therapy

## 2017-07-29 ENCOUNTER — Ambulatory Visit: Payer: BLUE CROSS/BLUE SHIELD | Admitting: Physical Therapy

## 2017-07-29 DIAGNOSIS — M545 Low back pain, unspecified: Secondary | ICD-10-CM

## 2017-07-29 DIAGNOSIS — M6281 Muscle weakness (generalized): Secondary | ICD-10-CM

## 2017-07-30 NOTE — Therapy (Signed)
[place] [facility] [address], [place], [address] Phone: [phone]   Fax:  [phone]  Physical Therapy Treatment  Patient Details  Name: Brandon Sweeney MRN: [id number] Date of Birth: 1960-08-18 Referring Provider: Jovita Gamma MD   Encounter Date: 07/29/2017  PT End of Session - 07/29/17 0833    Visit Number  4    Number of Visits  12    Date for PT Re-Evaluation  08/27/17    PT Start Time  0818    PT Stop Time  0902    PT Time Calculation (min)  44 min    Activity Tolerance  Patient tolerated treatment well    Behavior During Therapy  [facility] for tasks assessed/performed       Past Medical History:  Diagnosis Date  . Hypertension     Past Surgical History:  Procedure Laterality Date  . EXCISION MASS NECK N/A 06/20/2016   Procedure: EXCISION MASS NECK;  Surgeon: Carloyn Manner, MD;  Location: [facility] ORS;  Service: ENT;  Laterality: N/A;    There were no vitals filed for this visit.  Subjective Assessment - 07/29/17 0829    Subjective  Patient continues with pain left side lower back and side with side bending left. 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      Pertinent History  Patient reports initially having left hip pain with walking since 11/2016 and then sought medical evaluation 01/2017 and was treated for hip pain without results and then had MRI of lumbar spine with bulging discs with impingement and underwent surgery 04/30/2017. He has been out of work since 05/01/2107. He has been recuperating from surgery and having regular check ups up until 06/05/2017 and then tried to do a jumping jack and felt increased tenderness in lower back. He is still out of work due to back still healing.     Limitations  Lifting;Walking;Standing;House hold activities;Other (comment);Sitting  standing forklift operator/trainer    How long can you sit comfortably?  >1 hour    How long can you stand comfortably?  up to an hour    How long can you walk comfortably?  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  Yes    Pain Score  3     Pain Location  Back    Pain Orientation  Left;Lower;Anterior    Pain Descriptors / Indicators  Spasm;Aching;Sore    Pain Type  Acute pain;Surgical pain 05/01/2107    Pain Onset  More than a month ago    Pain Frequency  Intermittent        Objective: Palpation: left lower back paraspinal muscles with spasms, tenderness elicited with palpation  Treatment:  Manual therapy: 10 min goal: pain, spasms STM performed to lumbar spine paraspinal muscles with patient prone lying, superficial techniques   Therapeutic exercises:patient performed with demonstration, verbal cues and facilitation of therapist: goals: independent with home program, strength, pain Trunk exercises:  Raise 5# weight up and overhead x 15 reps Trunk rotation hip to hip with 5# weght x 25 Side bend with weight 5# x 15 reps each right/left lifting  20# kettle bell from chair using proper body mechanics  Modalities: Electrical stimulation: High volt estim.clincial program for muscle spasms  (2) electrodes applied to left side lower lumbar spine and around left pelvis with MHP x 15 min intensity to tolerance with patient in right side lying with left LE supported on pillow goal: pain, spasms  Patient response to treatment: patient demonstrated improved technique with exercises with minimal VC for correct alignment and good body mechanics for lifting. Patient with decreased pain from 3/10 to  <1/10 with side bend left following modalities an manual techniques.  Improved motor control with repetition and  cuing         PT Education - 07/29/17 0832    Education Details  exercise instruction, lifting guidelines, body mechanics    Person(s) Educated  Patient    Methods  Explanation;Demonstration;Verbal cues    Comprehension  Verbalized understanding;Returned demonstration;Verbal cues required          PT Long Term Goals - 07/16/17 1846      PT LONG TERM GOAL #1   Title  Patient will demonstrate improved function with daily activities with mild to no back symptoms as indicated by FOTO score of 72% or better and MODI score of <10%    Baseline  FOTO 62%; MODI 20%    Status  New    Target Date  08/27/17      PT LONG TERM GOAL #2   Title  Patient will be independent with home program for pain control, strength, flexibility to allow self management once discharged from physical therapy    Baseline  limited knowledge of appropriate exercises, pain control strategies without instruction, cuing    Status  New    Target Date  08/27/17            Plan - 07/29/17 0834    Clinical Impression Statement  Patient is progressing steadily with exercises and decreased pain in left side with treatment. He continues with pain in trunk and left hip/back intermittently. 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     Rehab Potential  Good    PT Frequency  2x / week    PT Duration  6 weeks    PT Treatment/Interventions  Cryotherapy;Electrical Stimulation;Moist Heat;Therapeutic activities;Patient/family education;Neuromuscular re-education;Manual techniques;Taping;Dry needling;Therapeutic exercise    PT Next Visit Plan  progressive therapeutic exercise; manual techniques, electrical stimulation, moist heat     PT Home Exercise Plan  body mechanics awareness, sitting core stabilization, LE stretching       Patient will benefit from skilled therapeutic intervention in  order to improve the following deficits and impairments:  Improper body mechanics, Postural dysfunction, Impaired perceived functional ability, Decreased range of motion, Decreased activity tolerance, Decreased strength  Visit Diagnosis: Muscle weakness (generalized)  Bilateral low back pain without sciatica, unspecified chronicity     Problem List There are no active problems to display for this patient.   Jomarie Longs PT 07/30/2017, 12:54 PM  [place] [facility] [address], [place], [address] Phone: [phone]   Fax:  [phone]  Name: Brandon Sweeney MRN: [id number] Date of Birth: [birth year]

## 2017-07-31 ENCOUNTER — Ambulatory Visit: Payer: BLUE CROSS/BLUE SHIELD | Admitting: Physical Therapy

## 2017-07-31 ENCOUNTER — Encounter: Payer: Self-pay | Admitting: Physical Therapy

## 2017-07-31 DIAGNOSIS — M545 Low back pain, unspecified: Secondary | ICD-10-CM

## 2017-07-31 DIAGNOSIS — M6281 Muscle weakness (generalized): Secondary | ICD-10-CM | POA: Diagnosis not present

## 2017-07-31 DIAGNOSIS — M62830 Muscle spasm of back: Secondary | ICD-10-CM

## 2017-07-31 NOTE — Therapy (Signed)
[place] [facility] [address], [place], [address] Phone: [phone]   Fax:  [phone]  Physical Therapy Treatment  Patient Details  Name: Brandon Sweeney MRN: [id number] Date of Birth: 1960-03-18 Referring Provider: Jovita Gamma MD   Encounter Date: 07/31/2017  PT End of Session - 07/31/17 1548    Visit Number  5    Number of Visits  12    Date for PT Re-Evaluation  08/27/17    PT Start Time  7078    PT Stop Time  1600    PT Time Calculation (min)  45 min    Activity Tolerance  Patient tolerated treatment well    Behavior During Therapy  [facility] for tasks assessed/performed       Past Medical History:  Diagnosis Date  . Hypertension     Past Surgical History:  Procedure Laterality Date  . EXCISION MASS NECK N/A 06/20/2016   Procedure: EXCISION MASS NECK;  Surgeon: Carloyn Manner, MD;  Location: [facility] ORS;  Service: ENT;  Laterality: N/A;    There were no vitals filed for this visit.  Subjective Assessment - 07/31/17 1518    Subjective  Patient reports he is having less pain intensity in left hip following previous session with STM.     Pertinent History  Patient reports initially having left hip pain with walking since 11/2016 and then sought medical evaluation 01/2017 and was treated for hip pain without results and then had MRI of lumbar spine with bulging discs with impingement and underwent surgery 04/30/2017. He has been out of work since 05/01/2107. He has been recuperating from surgery and having regular check ups up until 06/05/2017 and then tried to do a jumping jack and felt increased tenderness in lower back. He is still out of work due to back still healing.     Limitations  Lifting;Walking;Standing;House hold activities;Other (comment);Sitting standing forklift operator/trainer    How long can you sit comfortably?  >1 hour    How long can you stand comfortably?  up to an hour    How long can you  walk comfortably?  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  Yes    Pain Score  2     Pain Location  Back    Pain Orientation  Left;Lower;Anterior;Posterior    Pain Descriptors / Indicators  Spasm;Aching;Sore    Pain Type  Acute pain;Surgical pain 04/30/2017    Pain Onset  More than a month ago    Pain Frequency  Intermittent      Objective: Palpation: lumbar spine paraspinal muscles: moderate spasms palpable along left side lower thoracic to lower lumbar region  Treatment:   Manual therapy: 13 min goal: pain, spasms STM performed to bilateral lumbar spine paraspinal muscles with patient prone lying, superficial techniques    Therapeutic exercises: patient performed with demonstration, verbal cues and facilitation of therapist: goals: independent with home program, strength, pain Trunk exercises:  Raise 7# weight up and overhead x 15 reps Trunk rotation hip to hip with 7# weght x 25 Side bend with weight 7# x 15 reps each right/left large body blade press in front and chest press 2 x 30 seconds each  Quadruped: Cat/camel stretch x 5 reps Alternate LE and UE raise  x 5 reps each side with cuing for correct technique: more difficulty with raising left LE than right due to weakness     Modalities: Electrical stimulation: High volt estim.clincial program for muscle spasms  (2) electrodes applied to left side lower lumbar spine and around left pelvis with MHP x 10 min intensity to tolerance with patient prone lying goal: pain, spasms   Patient response to treatment: patient improved soft tissue elasticity by 50% left lower back and improved technique with exercises with minimal cuing and demonstration and repetition        PT Education - 07/31/17 1540    Education Details  exercise instruction; verbal review  of lifting guidelines     Person(s) Educated  Patient    Methods  Explanation;Demonstration;Verbal cues    Comprehension  Verbalized understanding;Returned demonstration;Verbal cues required          PT Long Term Goals - 07/16/17 1846      PT LONG TERM GOAL #1   Title  Patient will demonstrate improved function with daily activities with mild to no back symptoms as indicated by FOTO score of 72% or better and MODI score of <10%    Baseline  FOTO 62%; MODI 20%    Status  New    Target Date  08/27/17      PT LONG TERM GOAL #2   Title  Patient will be independent with home program for pain control, strength, flexibility to allow self management once discharged from physical therapy    Baseline  limited knowledge of appropriate exercises, pain control strategies without instruction, cuing    Status  New    Target Date  08/27/17            Plan - 07/31/17 1548    Clinical Impression Statement  Patient is progressing steadily with exercises and improving spasms in lumbar spine with STM. He continues with spasms, pain with movement lumbar spine and will require continued physical therapy intervention     Rehab Potential  Good    PT Frequency  2x / week    PT Duration  6 weeks    PT Treatment/Interventions  Cryotherapy;Electrical Stimulation;Moist Heat;Therapeutic activities;Patient/family education;Neuromuscular re-education;Manual techniques;Taping;Dry needling;Therapeutic exercise    PT Next Visit Plan  progressive therapeutic exercise; manual techniques, electrical stimulation, moist heat     PT Home Exercise Plan  body mechanics awareness, sitting core stabilization, LE stretching       Patient will benefit from skilled therapeutic intervention in order to improve the following deficits and impairments:  Improper body mechanics, Postural dysfunction, Impaired perceived functional ability, Decreased range of motion, Decreased activity tolerance, Decreased strength  Visit  Diagnosis: Muscle weakness (generalized)  Bilateral low back pain without sciatica, unspecified chronicity  Muscle spasm of back     Problem List There are no active problems to display for this patient.   Jomarie Longs PT 08/01/2017, 10:23 AM  [place] [facility] [address] [place], [address] Phone: [phone]   Fax:  [phone]  Name: Brandon Sweeney MRN: [id number] Date of Birth: [birth year]

## 2017-08-05 ENCOUNTER — Ambulatory Visit: Payer: BLUE CROSS/BLUE SHIELD | Attending: Neurosurgery | Admitting: Physical Therapy

## 2017-08-05 ENCOUNTER — Encounter: Payer: Self-pay | Admitting: Physical Therapy

## 2017-08-05 DIAGNOSIS — M62830 Muscle spasm of back: Secondary | ICD-10-CM | POA: Insufficient documentation

## 2017-08-05 DIAGNOSIS — M545 Low back pain, unspecified: Secondary | ICD-10-CM

## 2017-08-05 DIAGNOSIS — M6281 Muscle weakness (generalized): Secondary | ICD-10-CM | POA: Diagnosis not present

## 2017-08-05 NOTE — Therapy (Signed)
[place] [facility] [address] [place], [address] Phone: [phone]   Fax:  [phone]  Physical Therapy Treatment  Patient Details  Name: Brandon Sweeney MRN: [id number] Date of Birth: 1960/07/05 Referring Provider: Jovita Gamma MD   Encounter Date: 08/05/2017  PT End of Session - 08/05/17 0817    Visit Number  6    Number of Visits  12    Date for PT Re-Evaluation  08/27/17    PT Start Time  0811    PT Stop Time  0905    PT Time Calculation (min)  54 min    Activity Tolerance  Patient tolerated treatment well    Behavior During Therapy  [facility] for tasks assessed/performed       Past Medical History:  Diagnosis Date  . Hypertension     Past Surgical History:  Procedure Laterality Date  . EXCISION MASS NECK N/A 06/20/2016   Procedure: EXCISION MASS NECK;  Surgeon: Carloyn Manner, MD;  Location: [facility] ORS;  Service: ENT;  Laterality: N/A;    There were no vitals filed for this visit.  Subjective Assessment - 08/05/17 0811    Subjective  Patient reports he is doing better with decreased tenderness left lower back and into left thigh     Pertinent History  Patient reports initially having left hip pain with walking since 11/2016 and then sought medical evaluation 01/2017 and was treated for hip pain without results and then had MRI of lumbar spine with bulging discs with impingement and underwent surgery 04/30/2017. He has been out of work since 05/01/2107. He has been recuperating from surgery and having regular check ups up until 06/05/2017 and then tried to do a jumping jack and felt increased tenderness in lower back. He is still out of work due to back still healing.     Limitations  Lifting;Walking;Standing;House hold activities;Other (comment);Sitting standing forklift operator/trainer    How long can you sit comfortably?  >1 hour    How long can you stand comfortably?  up to an hour    How long can you  walk comfortably?  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  Yes    Pain Score  2     Pain Location  Back    Pain Orientation  Left;Lower;Anterior;Posterior    Pain Type  Acute pain;Surgical pain    Pain Onset  More than a month ago    Pain Frequency  Intermittent         Objective: Palpation: lumbar spine paraspinal muscles: moderate spasms palpable along left side lower thoracic to lower lumbar region AROM: lumbar spine lateral flexion WNL bilaterally with mild to no symptoms with side bend to left today  Treatment:   Manual therapy: 15 min goal: pain, spasms STM performed to bilateral lumbar spine paraspinal muscles with patient prone lying, superficial techniques: increased redness and warmth left side paraspinal muscles lower thoracic and along lumbar spine region: able to side bend to left without pain/tightness following treatment   Therapeutic exercises: patient performed with demonstration, verbal cues and facilitation of therapist: goals: independent with home program, strength, pain Trunk exercises:  Raise 9# weight up and overhead x 15 reps Trunk rotation hip to hip with 9# weght x 15 Side  bend with weight 9# x 15 reps each right/left large body blade press in front and chest press 2 x 30 seconds each   Quadruped: Cat/camel stretch x 5 reps Alternate LE and UE raise x 5 reps each side with cuing for correct techniqueechnique and motor contro from previous session  Modalities:  Electrical stimulation: High volt estim.clincial program for muscle spasms  (4) electrodes applied to left and right side lower lumbar spine bilaterally intensity to tolerance with ice pack applied to same x 15 min with patient prone lying goal: pain, spasms   Patient response to treatment: Improved soft tissue elasticity in  left side lower lumbar paraspinal muscles 50% and decreased area of spasms as compared to previous session. improved motor control and technique with exercises with minimal cuing and repetition.       PT Education - 08/05/17 0816    Education Details  exercise instruction    Person(s) Educated  Patient    Methods  Explanation;Demonstration;Verbal cues    Comprehension  Verbalized understanding;Returned demonstration;Verbal cues required          PT Long Term Goals - 07/16/17 1846      PT LONG TERM GOAL #1   Title  Patient will demonstrate improved function with daily activities with mild to no back symptoms as indicated by FOTO score of 72% or better and MODI score of <10%    Baseline  FOTO 62%; MODI 20%    Status  New    Target Date  08/27/17      PT LONG TERM GOAL #2   Title  Patient will be independent with home program for pain control, strength, flexibility to allow self management once discharged from physical therapy    Baseline  limited knowledge of appropriate exercises, pain control strategies without instruction, cuing    Status  New    Target Date  08/27/17            Plan - 08/05/17 1007    Clinical Impression Statement  Patient is progressing well with goals with decreasing pain and spasms in lumbar spine paraspinal muscles. He is improving core control ROM and strength with guided physical therapy exercises and should continue to improve with additional physical therapy intervention.     Rehab Potential  Good    PT Frequency  2x / week    PT Duration  6 weeks    PT Treatment/Interventions  Cryotherapy;Electrical Stimulation;Moist Heat;Therapeutic activities;Patient/family education;Neuromuscular re-education;Manual techniques;Taping;Dry needling;Therapeutic exercise    PT Next Visit Plan  progressive therapeutic exercise; manual techniques, electrical stimulation, moist heat     PT Home Exercise Plan  body mechanics awareness, sitting core stabilization, LE  stretching       Patient will benefit from skilled therapeutic intervention in order to improve the following deficits and impairments:  Improper body mechanics, Postural dysfunction, Impaired perceived functional ability, Decreased range of motion, Decreased activity tolerance, Decreased strength  Visit Diagnosis: Muscle weakness (generalized)  Bilateral low back pain without sciatica, unspecified chronicity  Muscle spasm of back     Problem List There are no active problems to display for this patient.   Jomarie Longs PT 08/05/2017, 10:09 AM  [place] [facility] [address] [place], [address] Phone: [phone]   Fax:  [phone]  Name: Brandon Sweeney MRN: [id number] Date of Birth: 1960/12/25

## 2017-08-11 ENCOUNTER — Ambulatory Visit: Payer: BLUE CROSS/BLUE SHIELD | Admitting: Physical Therapy

## 2017-08-12 ENCOUNTER — Ambulatory Visit: Payer: BLUE CROSS/BLUE SHIELD | Admitting: Physical Therapy

## 2017-08-14 ENCOUNTER — Ambulatory Visit: Payer: BLUE CROSS/BLUE SHIELD | Admitting: Physical Therapy

## 2017-08-19 ENCOUNTER — Encounter: Payer: BLUE CROSS/BLUE SHIELD | Admitting: Physical Therapy

## 2017-08-21 ENCOUNTER — Encounter: Payer: BLUE CROSS/BLUE SHIELD | Admitting: Physical Therapy

## 2017-12-12 ENCOUNTER — Encounter: Payer: Self-pay | Admitting: *Deleted

## 2017-12-15 ENCOUNTER — Ambulatory Visit: Payer: BLUE CROSS/BLUE SHIELD | Admitting: Anesthesiology

## 2017-12-15 ENCOUNTER — Encounter: Payer: Self-pay | Admitting: Anesthesiology

## 2017-12-15 ENCOUNTER — Other Ambulatory Visit: Payer: Self-pay

## 2017-12-15 ENCOUNTER — Ambulatory Visit
Admission: RE | Admit: 2017-12-15 | Discharge: 2017-12-15 | Disposition: A | Discharge status: Home / Self Care | Payer: BLUE CROSS/BLUE SHIELD | Source: Ambulatory Visit | Attending: Unknown Physician Specialty | Admitting: Unknown Physician Specialty

## 2017-12-15 ENCOUNTER — Encounter
Admission: RE | Disposition: A | Discharge status: Home / Self Care | Payer: Self-pay | Source: Ambulatory Visit | Attending: Unknown Physician Specialty

## 2017-12-15 DIAGNOSIS — K648 Other hemorrhoids: Secondary | ICD-10-CM | POA: Diagnosis not present

## 2017-12-15 DIAGNOSIS — Z86010 Personal history of colonic polyps: Secondary | ICD-10-CM | POA: Diagnosis not present

## 2017-12-15 DIAGNOSIS — Z8371 Family history of colonic polyps: Secondary | ICD-10-CM | POA: Insufficient documentation

## 2017-12-15 DIAGNOSIS — E119 Type 2 diabetes mellitus without complications: Secondary | ICD-10-CM | POA: Diagnosis not present

## 2017-12-15 DIAGNOSIS — Z7984 Long term (current) use of oral hypoglycemic drugs: Secondary | ICD-10-CM | POA: Insufficient documentation

## 2017-12-15 DIAGNOSIS — I10 Essential (primary) hypertension: Secondary | ICD-10-CM | POA: Diagnosis not present

## 2017-12-15 DIAGNOSIS — K219 Gastro-esophageal reflux disease without esophagitis: Secondary | ICD-10-CM | POA: Diagnosis not present

## 2017-12-15 DIAGNOSIS — Z79899 Other long term (current) drug therapy: Secondary | ICD-10-CM | POA: Diagnosis not present

## 2017-12-15 DIAGNOSIS — D122 Benign neoplasm of ascending colon: Secondary | ICD-10-CM | POA: Insufficient documentation

## 2017-12-15 DIAGNOSIS — Z1211 Encounter for screening for malignant neoplasm of colon: Secondary | ICD-10-CM | POA: Diagnosis not present

## 2017-12-15 DIAGNOSIS — K5730 Diverticulosis of large intestine without perforation or abscess without bleeding: Secondary | ICD-10-CM | POA: Insufficient documentation

## 2017-12-15 HISTORY — PX: COLONOSCOPY WITH PROPOFOL: SHX5780

## 2017-12-15 LAB — GLUCOSE, CAPILLARY: Glucose-Capillary: 170 mg/dL — ABNORMAL HIGH (ref 70–99)

## 2017-12-15 SURGERY — COLONOSCOPY WITH PROPOFOL
Anesthesia: General

## 2017-12-15 MED ORDER — FENTANYL CITRATE (PF) 100 MCG/2ML IJ SOLN
INTRAMUSCULAR | Status: DC | PRN
  Administered 2017-12-15 (×2): 50 ug via INTRAVENOUS

## 2017-12-15 MED ORDER — PROPOFOL 500 MG/50ML IV EMUL
INTRAVENOUS | Status: AC
  Filled 2017-12-15: qty 50

## 2017-12-15 MED ORDER — MIDAZOLAM HCL 2 MG/2ML IJ SOLN
INTRAMUSCULAR | Status: AC
  Filled 2017-12-15: qty 2

## 2017-12-15 MED ORDER — FENTANYL CITRATE (PF) 100 MCG/2ML IJ SOLN
INTRAMUSCULAR | Status: AC
  Filled 2017-12-15: qty 2

## 2017-12-15 MED ORDER — SODIUM CHLORIDE 0.9 % IV SOLN
INTRAVENOUS | Status: DC
  Administered 2017-12-15: 07:00:00 via INTRAVENOUS

## 2017-12-15 MED ORDER — PROPOFOL 500 MG/50ML IV EMUL
INTRAVENOUS | Status: DC | PRN
  Administered 2017-12-15: 120 ug/kg/min via INTRAVENOUS

## 2017-12-15 MED ORDER — MIDAZOLAM HCL 2 MG/2ML IJ SOLN
INTRAMUSCULAR | Status: DC | PRN
  Administered 2017-12-15: 2 mg via INTRAVENOUS

## 2017-12-15 MED ORDER — SODIUM CHLORIDE 0.9 % IV SOLN: INTRAVENOUS | Status: DC

## 2017-12-15 NOTE — Anesthesia Postprocedure Evaluation (Signed)
Anesthesia Post Note  Patient: Brandon Sweeney  Procedure(s) Performed: COLONOSCOPY WITH PROPOFOL (N/A )  Patient location during evaluation: Endoscopy Anesthesia Type: General Level of consciousness: awake and alert Pain management: pain level controlled Vital Signs Assessment: post-procedure vital signs reviewed and stable Respiratory status: spontaneous breathing and respiratory function stable Cardiovascular status: stable Anesthetic complications: no     Last Vitals:  Vitals:   12/15/17 0709 12/15/17 0810  BP: (!) 154/103 (!) 96/59  Pulse: 91 80  Resp: 18 15  Temp: (!) 35.4 C (!) 36.2 C  SpO2: 96% 96%    Last Pain:  Vitals:   12/15/17 0810  TempSrc: Tympanic  PainSc: 0-No pain                 [name] K

## 2017-12-15 NOTE — Op Note (Signed)
[facility]astroenterology Patient Name: Brandon Sweeney Procedure Date: 12/15/2017 7:35 AM MRN: [id number] Account #: [id number] Date of Birth: 05-25-1960 Admit Type: Outpatient Age: 57 Room: [facility] ENDO ROOM 1 Gender: Male Note Status: Finalized Procedure:            Colonoscopy Indications:          Colon cancer screening in patient at increased risk:                        Family history of 1st-degree relative with colon polyps Providers:            Manya Silvas, MD Referring MD:         Leona Carry. Hall Busing, MD (Referring MD) Medicines:            Propofol per Anesthesia Complications:        No immediate complications. Procedure:            Pre-Anesthesia Assessment:                       - After reviewing the risks and benefits, the patient                        was deemed in satisfactory condition to undergo the                        procedure.                       After obtaining informed consent, the colonoscope was                        passed under direct vision. Throughout the procedure,                        the patient's blood pressure, pulse, and oxygen                        saturations were monitored continuously. The                        Colonoscope was introduced through the anus and                        advanced to the the cecum, identified by appendiceal                        orifice and ileocecal valve. The colonoscopy was                        performed without difficulty. The patient tolerated the                        procedure well. The quality of the bowel preparation                        was good. Findings:      A 20 mm polyp was found in the distal ascending colon. The polyp was       sessile. The polyp was removed with a hot snare. Resection and retrieval       were complete. Basket used  to remove polyp.To prevent bleeding after the       polypectomy, two hemostatic clips were successfully placed. There was no   bleeding at the end of the procedure.      A few small-mouthed diverticula were found in the sigmoid colon and       descending colon.      Internal hemorrhoids were found during endoscopy. The hemorrhoids were       small.      The terminal ileum appeared normal.      The exam was otherwise without abnormality. Impression:           - One 20 mm polyp in the distal ascending colon,                        removed with a hot snare. Resected and retrieved. Clips                        were placed.                       - Diverticulosis in the sigmoid colon and in the                        descending colon.                       - Internal hemorrhoids.                       - The examined portion of the ileum was normal.                       - The examination was otherwise normal. Recommendation:       - Await pathology results. Manya Silvas, MD 12/15/2017 8:12:03 AM This report has been signed electronically. Number of Addenda: 0 Note Initiated On: 12/15/2017 7:35 AM Scope Withdrawal Time: 0 hours 17 minutes 43 seconds  Total Procedure Duration: 0 hours 23 minutes 53 seconds       [facility]

## 2017-12-15 NOTE — Transfer of Care (Signed)
Immediate Anesthesia Transfer of Care Note  Patient: Brandon Sweeney  Procedure(s) Performed: COLONOSCOPY WITH PROPOFOL (N/A )  Patient Location: PACU  Anesthesia Type:General  Level of Consciousness: awake and sedated  Airway & Oxygen Therapy: Patient Spontanous Breathing and Patient connected to face mask oxygen  Post-op Assessment: Report given to RN and Post -op Vital signs reviewed and stable  Post vital signs: Reviewed and stable  Last Vitals:  Vitals Value Taken Time  BP    Temp    Pulse    Resp    SpO2      Last Pain:  Vitals:   12/15/17 0709  TempSrc: Tympanic  PainSc: 0-No pain         Complications: No apparent anesthesia complications

## 2017-12-15 NOTE — H&P (Signed)
Primary Care Physician:  Albina Billet, MD Primary Gastroenterologist:  Dr. Vira Agar  Pre-Procedure History & Physical: HPI:  Brandon Sweeney is a 57 y.o. male is here for an colonoscopy.  FH colon polyps.   Past Medical History:  Diagnosis Date  . Diabetes mellitus without complication ([place])   . Hypertension     Past Surgical History:  Procedure Laterality Date  . BACK SURGERY  04/30/2017  . EXCISION MASS NECK N/A 06/20/2016   Procedure: EXCISION MASS NECK;  Surgeon: Carloyn Manner, MD;  Location: [facility] ORS;  Service: ENT;  Laterality: N/A;    Prior to Admission medications   Medication Sig Start Date End Date Taking? Authorizing Provider  ARGININE PO Take 500 mg by mouth.   Yes [provider]  benazepril (LOTENSIN) 20 MG tablet Take 20 mg by mouth daily.   Yes [provider]  metFORMIN (GLUCOPHAGE) 500 MG tablet Take by mouth daily with breakfast.   Yes [provider]  Red Yeast Rice Extract (RED YEAST RICE PO) Take by mouth daily.   Yes [provider]  ACAI BERRY PO Take 2 tablets by mouth daily.    [provider]  amoxicillin-clavulanate (AUGMENTIN) 875-125 MG tablet Take 1 tablet by mouth 2 (two) times daily. Patient not taking: Reported on 03/10/2017 06/20/16   Carloyn Manner, MD  calcium carbonate (TUMS - DOSED IN MG ELEMENTAL CALCIUM) 500 MG chewable tablet Chew 1 tablet by mouth daily.    [provider]  Cinnamon Bark POWD 1,500 mg by Does not apply route.    [provider]  Flaxseed, Linseed, (FLAXSEED OIL) 1000 MG CAPS Take 1,000 mg by mouth.    [provider]  gentamicin ointment (GARAMYCIN) 0.1 % Apply 1 application topically 3 (three) times daily. Patient not taking: Reported on 07/16/2017 06/20/16   Carloyn Manner, MD  L-ARGININE PO Take 2 tablets by mouth daily.    [provider]  magnesium oxide (MAG-OX) 400 MG tablet Take 400 mg by mouth daily.    [provider]   Olive Leaf Extract 250 MG CAPS Take 250 mg by mouth.    [provider]  Omega-3 Fatty Acids (FISH OIL) 1000 MG CAPS Take 1 capsule by mouth daily.    [provider]  ondansetron (ZOFRAN) 4 MG tablet Take 1 tablet (4 mg total) by mouth every 8 (eight) hours as needed for nausea or vomiting. Patient not taking: Reported on 03/10/2017 06/20/16   Carloyn Manner, MD  oxyCODONE-acetaminophen (ROXICET) 5-325 MG tablet Take 1-2 tablets by mouth every 4 (four) hours as needed for severe pain. Patient not taking: Reported on 03/10/2017 06/20/16   Carloyn Manner, MD  TURMERIC PO Take 1 tablet by mouth daily.    [provider]  vitamin E 400 UNIT capsule Take 400 Units by mouth daily.    [provider]    Allergies as of 07/23/2017  . (No Known Allergies)    Family History  Problem Relation Age of Onset  . Colon polyps Mother     Social History   Socioeconomic History  . Marital status: Married    Spouse name: Not on file  . Number of children: Not on file  . Years of education: Not on file  . Highest education level: Not on file  Occupational History  . Not on file  Social Needs  . Financial resource strain: Not on file  . Food insecurity:    Worry: Not on file  Inability: Not on file  . Transportation needs:    Medical: Not on file    Non-medical: Not on file  Tobacco Use  . Smoking status: Never Smoker  . Smokeless tobacco: Never Used  Substance and Sexual Activity  . Alcohol use: Never    Frequency: Never  . Drug use: Never  . Sexual activity: Not on file  Lifestyle  . Physical activity:    Days per week: Not on file    Minutes per session: Not on file  . Stress: Not on file  Relationships  . Social connections:    Talks on phone: Not on file    Gets together: Not on file    Attends religious service: Not on file    Active member of club or organization: Not on file    Attends meetings of clubs or organizations: Not on file     Relationship status: Not on file  . Intimate partner violence:    Fear of current or ex partner: Not on file    Emotionally abused: Not on file    Physically abused: Not on file    Forced sexual activity: Not on file  Other Topics Concern  . Not on file  Social History Narrative  . Not on file    Review of Systems: See HPI, otherwise negative ROS  Physical Exam: BP (!) 154/103 Comment: Dr. Ronelle Nigh aware  Pulse 91   Temp (!) 95.7 F (35.4 C) (Tympanic)   Resp 18   Ht 6' (1.829 m)   Wt 107.3 kg   SpO2 96%   BMI 32.08 kg/m  General:   Alert,  pleasant and cooperative in NAD Head:  Normocephalic and atraumatic. Neck:  Supple; no masses or thyromegaly. Lungs:  Clear throughout to auscultation.    Heart:  Regular rate and rhythm. Abdomen:  Soft, nontender and nondistended. Normal bowel sounds, without guarding, and without rebound.   Neurologic:  Alert and  oriented x4;  grossly normal neurologically.  Impression/Plan: Brandon Sweeney is here for an colonoscopy to be performed for FH colon polyps, last colonoscopy was 03/27/12  Risks, benefits, limitations, and alternatives regarding  colonoscopy have been reviewed with the patient.  Questions have been answered.  All parties agreeable.   Gaylyn Cheers, MD  12/15/2017, 7:31 AM

## 2017-12-15 NOTE — Anesthesia Preprocedure Evaluation (Signed)
Anesthesia Evaluation  Patient identified by MRN, date of birth, ID band Patient awake    Reviewed: Allergy & Precautions, NPO status , Patient's Chart, lab work & pertinent test results  History of Anesthesia Complications Negative for: history of anesthetic complications  Airway Mallampati: III       Dental   Pulmonary neg sleep apnea, neg COPD,           Cardiovascular hypertension, Pt. on medications (-) Past MI and (-) CHF (-) dysrhythmias (-) Valvular Problems/Murmurs     Neuro/Psych neg Seizures    GI/Hepatic Neg liver ROS, GERD  Medicated and Controlled,  Endo/Other  diabetes, Type 2, Oral Hypoglycemic Agents  Renal/GU negative Renal ROS     Musculoskeletal   Abdominal   Peds  Hematology   Anesthesia Other Findings   Reproductive/Obstetrics                             Anesthesia Physical Anesthesia Plan  ASA: III  Anesthesia Plan: General   Post-op Pain Management:    Induction: Intravenous  PONV Risk Score and Plan: 2 and Propofol infusion and TIVA  Airway Management Planned: Nasal Cannula  Additional Equipment:   Intra-op Plan:   Post-operative Plan:   Informed Consent: I have reviewed the patients History and Physical, chart, labs and discussed the procedure including the risks, benefits and alternatives for the proposed anesthesia with the patient or authorized representative who has indicated his/her understanding and acceptance.     Plan Discussed with:   Anesthesia Plan Comments:         Anesthesia Quick Evaluation

## 2017-12-15 NOTE — Anesthesia Procedure Notes (Signed)
Performed by: Vaughan Sine Pre-anesthesia Checklist: Patient identified, Emergency Drugs available, Suction available, Patient being monitored and Timeout performed Patient Re-evaluated:Patient Re-evaluated prior to induction Oxygen Delivery Method: Simple face mask Preoxygenation: Pre-oxygenation with 100% oxygen Induction Type: IV induction Ventilation: Oral airway inserted - appropriate to patient size Placement Confirmation: CO2 detector and positive ETCO2

## 2017-12-15 NOTE — Anesthesia Post-op Follow-up Note (Signed)
Anesthesia QCDR form completed.        

## 2017-12-16 ENCOUNTER — Encounter: Payer: Self-pay | Admitting: Unknown Physician Specialty

## 2017-12-17 LAB — SURGICAL PATHOLOGY

## 2018-11-13 ENCOUNTER — Other Ambulatory Visit: Payer: Self-pay

## 2018-11-13 DIAGNOSIS — Z20822 Contact with and (suspected) exposure to covid-19: Secondary | ICD-10-CM

## 2018-11-14 LAB — NOVEL CORONAVIRUS, NAA: SARS-CoV-2, NAA: NOT DETECTED

## 2019-12-22 ENCOUNTER — Emergency Department
Admission: EM | Admit: 2019-12-22 | Discharge: 2019-12-22 | Disposition: A | Discharge status: Home / Self Care | Payer: BC Managed Care – PPO | Attending: Emergency Medicine | Admitting: Emergency Medicine

## 2019-12-22 ENCOUNTER — Other Ambulatory Visit: Payer: Self-pay

## 2019-12-22 DIAGNOSIS — Y288XXA Contact with other sharp object, undetermined intent, initial encounter: Secondary | ICD-10-CM | POA: Insufficient documentation

## 2019-12-22 DIAGNOSIS — Z794 Long term (current) use of insulin: Secondary | ICD-10-CM | POA: Insufficient documentation

## 2019-12-22 DIAGNOSIS — S61210A Laceration without foreign body of right index finger without damage to nail, initial encounter: Secondary | ICD-10-CM | POA: Insufficient documentation

## 2019-12-22 DIAGNOSIS — Z23 Encounter for immunization: Secondary | ICD-10-CM | POA: Diagnosis not present

## 2019-12-22 DIAGNOSIS — I10 Essential (primary) hypertension: Secondary | ICD-10-CM | POA: Diagnosis not present

## 2019-12-22 DIAGNOSIS — E119 Type 2 diabetes mellitus without complications: Secondary | ICD-10-CM | POA: Insufficient documentation

## 2019-12-22 DIAGNOSIS — Z79899 Other long term (current) drug therapy: Secondary | ICD-10-CM | POA: Diagnosis not present

## 2019-12-22 MED ORDER — CEPHALEXIN 500 MG PO CAPS: 500.0000 mg | ORAL_CAPSULE | Freq: Three times a day (TID) | ORAL | 0 refills | Status: AC

## 2019-12-22 MED ORDER — TETANUS-DIPHTH-ACELL PERTUSSIS 5-2.5-18.5 LF-MCG/0.5 IM SUSY
0.5000 mL | PREFILLED_SYRINGE | Freq: Once | INTRAMUSCULAR | Status: AC
  Administered 2019-12-22: 0.5 mL via INTRAMUSCULAR
  Filled 2019-12-22: qty 0.5

## 2019-12-22 NOTE — ED Provider Notes (Signed)
Emergency Department Provider Note  ____________________________________________  Time seen: Approximately 11:46 PM  I have reviewed the triage vital signs and the nursing notes.   HISTORY  Chief Complaint Laceration   Historian Patient     HPI Brandon Sweeney is a 59 y.o. male with a history of diabetes, presents to the ED with a 1.5 cm linear superficial dorsal right index finger laceration sustained accidentally with a drink can. No numbness or tingling. Patient cannot recall his last tetanus shot.     Past Medical History:  Diagnosis Date  . Diabetes mellitus without complication ([place])   . Hypertension      Immunizations up to date:  Yes.     Past Medical History:  Diagnosis Date  . Diabetes mellitus without complication ([place])   . Hypertension     There are no problems to display for this patient.   Past Surgical History:  Procedure Laterality Date  . BACK SURGERY  04/30/2017  . COLONOSCOPY WITH PROPOFOL N/A 12/15/2017   Procedure: COLONOSCOPY WITH PROPOFOL;  Surgeon: Manya Silvas, MD;  Location: [facility] ENDOSCOPY;  Service: Endoscopy;  Laterality: N/A;  . EXCISION MASS NECK N/A 06/20/2016   Procedure: EXCISION MASS NECK;  Surgeon: Carloyn Manner, MD;  Location: [facility] ORS;  Service: ENT;  Laterality: N/A;    Prior to Admission medications   Medication Sig Start Date End Date Taking? Authorizing Provider  ACAI BERRY PO Take 2 tablets by mouth daily.    [provider]  amoxicillin-clavulanate (AUGMENTIN) 875-125 MG tablet Take 1 tablet by mouth 2 (two) times daily. Patient not taking: Reported on 03/10/2017 06/20/16   Carloyn Manner, MD  ARGININE PO Take 500 mg by mouth.    [provider]  benazepril (LOTENSIN) 20 MG tablet Take 20 mg by mouth daily.    [provider]  calcium carbonate (TUMS - DOSED IN MG ELEMENTAL CALCIUM) 500 MG chewable tablet Chew 1 tablet by mouth daily.    [provider]  cephALEXin  (KEFLEX) 500 MG capsule Take 1 capsule (500 mg total) by mouth 3 (three) times daily for 7 days. 12/22/19 12/29/19  Lannie Fields, PA-C  Cinnamon Bark POWD 1,500 mg by Does not apply route.    [provider]  Flaxseed, Linseed, (FLAXSEED OIL) 1000 MG CAPS Take 1,000 mg by mouth.    [provider]  gentamicin ointment (GARAMYCIN) 0.1 % Apply 1 application topically 3 (three) times daily. Patient not taking: Reported on 07/16/2017 06/20/16   Carloyn Manner, MD  L-ARGININE PO Take 2 tablets by mouth daily.    [provider]  magnesium oxide (MAG-OX) 400 MG tablet Take 400 mg by mouth daily.    [provider]  metFORMIN (GLUCOPHAGE) 500 MG tablet Take by mouth daily with breakfast.    [provider]  Olive Leaf Extract 250 MG CAPS Take 250 mg by mouth.    [provider]  Omega-3 Fatty Acids (FISH OIL) 1000 MG CAPS Take 1 capsule by mouth daily.    [provider]  ondansetron (ZOFRAN) 4 MG tablet Take 1 tablet (4 mg total) by mouth every 8 (eight) hours as needed for nausea or vomiting. Patient not taking: Reported on 03/10/2017 06/20/16   Carloyn Manner, MD  oxyCODONE-acetaminophen (ROXICET) 5-325 MG tablet Take 1-2 tablets by mouth every 4 (four) hours as needed for severe pain. Patient not taking: Reported on 03/10/2017 06/20/16   Carloyn Manner, MD  Red Yeast Rice Extract (RED YEAST RICE  PO) Take by mouth daily.    [provider]  TURMERIC PO Take 1 tablet by mouth daily.    [provider]  vitamin E 400 UNIT capsule Take 400 Units by mouth daily.    [provider]    Allergies Patient has no known allergies.  Family History  Problem Relation Age of Onset  . Colon polyps Mother     Social History Social History   Tobacco Use  . Smoking status: Never Smoker  . Smokeless tobacco: Never Used  Vaping Use  . Vaping Use: Never used  Substance Use Topics  . Alcohol use: Never  . Drug  use: Never     Review of Systems  Constitutional: No fever/chills Eyes:  No discharge ENT: No upper respiratory complaints. Respiratory: no cough. No SOB/ use of accessory muscles to breath Gastrointestinal:   No nausea, no vomiting.  No diarrhea.  No constipation. Musculoskeletal: Negative for musculoskeletal pain. Skin: Patient has laceration.    ____________________________________________   PHYSICAL EXAM:  VITAL SIGNS: ED Triage Vitals  Enc Vitals Group     BP 12/22/19 2027 (!) 151/102     Pulse Rate 12/22/19 2027 94     Resp 12/22/19 2027 16     Temp 12/22/19 2027 98.1 F (36.7 C)     Temp Source 12/22/19 2027 Oral     SpO2 12/22/19 2027 96 %     Weight 12/22/19 2029 234 lb (106.1 kg)     Height 12/22/19 2029 6' (1.829 m)     Head Circumference --      Peak Flow --      Pain Score 12/22/19 2029 2     Pain Loc --      Pain Edu? --      Excl. in [place]? --      Constitutional: Alert and oriented. Well appearing and in no acute distress. Eyes: Conjunctivae are normal. PERRL. EOMI. Head: Atraumatic. Cardiovascular: Normal rate, regular rhythm. Normal S1 and S2.  Good peripheral circulation. Respiratory: Normal respiratory effort without tachypnea or retractions. Lungs CTAB. Good air entry to the bases with no decreased or absent breath sounds Musculoskeletal: Full range of motion to all extremities. No obvious deformities noted. No flexor or extensor tendon deficits with right index finger testing. Palpable radial pulse, right. Cap refill less than 2 seconds on the right.  Neurologic:  Normal for age. No gross focal neurologic deficits are appreciated.  Skin:  Skin is warm, dry and intact. No rash noted. Psychiatric: Mood and affect are normal for age. Speech and behavior are normal.   ____________________________________________   LABS (all labs ordered are listed, but only abnormal results are displayed)  Labs Reviewed - No data to  display ____________________________________________  EKG   ____________________________________________  RADIOLOGY   No results found.  ____________________________________________    PROCEDURES  Procedure(s) performed:     Marland KitchenMarland KitchenLaceration Repair  Date/Time: 12/22/2019 11:48 PM Performed by: Lannie Fields, PA-C Authorized by: Lannie Fields, PA-C   Consent:    Consent obtained:  Verbal   Consent given by:  Patient Anesthesia (see MAR for exact dosages):    Anesthesia method:  None Repair type:    Repair type:  Simple Exploration:    Contaminated: no   Treatment:    Amount of cleaning:  Standard   Irrigation solution:  Sterile saline   Visualized foreign bodies/material removed: no   Skin repair:    Repair method:  Tissue adhesive Approximation:  Approximation:  Close Post-procedure details:    Dressing:  Open (no dressing)   Patient tolerance of procedure:  Tolerated well, no immediate complications       Medications  Tdap (BOOSTRIX) injection 0.5 mL (0.5 mLs Intramuscular Given 12/22/19 2319)     ____________________________________________   INITIAL IMPRESSION / ASSESSMENT AND PLAN / ED COURSE  Pertinent labs & imaging results that were available during my care of the patient were reviewed by me and considered in my medical decision making (see chart for details).     Assessment and Plan: Laceration 59 year old male presents to ED with right index finger repaired in ED with dermabond. Patient's right index finger was splinted into extension and he was discharged with Keflex. Patient's tetanus status was updated in the ED. Return precautions were given to return with new or worsening symptoms.     ____________________________________________  FINAL CLINICAL IMPRESSION(S) / ED DIAGNOSES  Final diagnoses:  Laceration of right index finger without foreign body without damage to nail, initial encounter      NEW MEDICATIONS STARTED  DURING THIS VISIT:  ED Discharge Orders         Ordered    cephALEXin (KEFLEX) 500 MG capsule  3 times daily        12/22/19 2304              This chart was dictated using voice recognition software/Dragon. Despite best efforts to proofread, errors can occur which can change the meaning. Any change was purely unintentional.     Lannie Fields, PA-C 12/22/19 2350    Blake Divine, MD 12/25/19 1315

## 2019-12-22 NOTE — Discharge Instructions (Signed)
Take Keflex three times daily for the next seven days.  Keep wound clean and dry for the next 24 hours.

## 2019-12-22 NOTE — ED Triage Notes (Signed)
Patient c/o 1/2" laceration to second knuckle index right hand from can top.

## 2020-01-18 ENCOUNTER — Other Ambulatory Visit: Payer: Self-pay

## 2020-01-18 ENCOUNTER — Encounter: Payer: Self-pay | Admitting: Emergency Medicine

## 2020-01-18 ENCOUNTER — Emergency Department: Payer: BC Managed Care – PPO

## 2020-01-18 ENCOUNTER — Emergency Department
Admission: EM | Admit: 2020-01-18 | Discharge: 2020-01-18 | DRG: 179 | Disposition: A | Discharge status: Home / Self Care | Payer: BC Managed Care – PPO | Attending: Internal Medicine | Admitting: Internal Medicine

## 2020-01-18 DIAGNOSIS — I10 Essential (primary) hypertension: Secondary | ICD-10-CM | POA: Diagnosis present

## 2020-01-18 DIAGNOSIS — R197 Diarrhea, unspecified: Secondary | ICD-10-CM | POA: Diagnosis present

## 2020-01-18 DIAGNOSIS — U071 COVID-19: Secondary | ICD-10-CM | POA: Diagnosis present

## 2020-01-18 DIAGNOSIS — Z8371 Family history of colonic polyps: Secondary | ICD-10-CM

## 2020-01-18 DIAGNOSIS — Z7984 Long term (current) use of oral hypoglycemic drugs: Secondary | ICD-10-CM | POA: Diagnosis not present

## 2020-01-18 DIAGNOSIS — I43 Cardiomyopathy in diseases classified elsewhere: Secondary | ICD-10-CM | POA: Diagnosis present

## 2020-01-18 DIAGNOSIS — E119 Type 2 diabetes mellitus without complications: Secondary | ICD-10-CM

## 2020-01-18 DIAGNOSIS — Z79899 Other long term (current) drug therapy: Secondary | ICD-10-CM

## 2020-01-18 LAB — URINALYSIS, COMPLETE (UACMP) WITH MICROSCOPIC
Bilirubin Urine: NEGATIVE
Glucose, UA: >=500 mg/dL — AB
Hgb urine dipstick: NEGATIVE
Ketones, ur: 80 mg/dL — AB
Leukocytes,Ua: NEGATIVE
Nitrite: NEGATIVE
Protein, ur: 30 mg/dL — AB
Specific Gravity, Urine: 1.039 — ABNORMAL HIGH (ref 1.005–1.030)
pH: 5 (ref 5.0–8.0)

## 2020-01-18 LAB — COMPREHENSIVE METABOLIC PANEL
ALT: 36 U/L (ref 0–44)
ALT: 34 U/L (ref 0–44)
AST: 33 U/L (ref 15–41)
AST: 30 U/L (ref 15–41)
Albumin: 3.9 g/dL (ref 3.5–5.0)
Albumin: 3.5 g/dL (ref 3.5–5.0)
Alkaline Phosphatase: 48 U/L (ref 38–126)
Alkaline Phosphatase: 40 U/L (ref 38–126)
Anion gap: 19 — ABNORMAL HIGH (ref 5–15)
Anion gap: 15 (ref 5–15)
BUN: 24 mg/dL — ABNORMAL HIGH (ref 8–23)
BUN: 24 mg/dL — ABNORMAL HIGH (ref 8–23)
CO2: 16 mmol/L — ABNORMAL LOW (ref 22–32)
CO2: 23 mmol/L (ref 22–32)
Calcium: 8.7 mg/dL — ABNORMAL LOW (ref 8.9–10.3)
Calcium: 8.2 mg/dL — ABNORMAL LOW (ref 8.9–10.3)
Chloride: 102 mmol/L (ref 98–111)
Chloride: 102 mmol/L (ref 98–111)
Creatinine, Ser: 1.06 mg/dL (ref 0.61–1.24)
Creatinine, Ser: 1.22 mg/dL (ref 0.61–1.24)
GFR, Estimated: >60 mL/min (ref >60)
GFR, Estimated: >60 mL/min (ref >60)
Glucose, Bld: 166 mg/dL — ABNORMAL HIGH (ref 70–99)
Glucose, Bld: 130 mg/dL — ABNORMAL HIGH (ref 70–99)
Potassium: 4.1 mmol/L (ref 3.5–5.1)
Potassium: 4.9 mmol/L (ref 3.5–5.1)
Sodium: 137 mmol/L (ref 135–145)
Sodium: 140 mmol/L (ref 135–145)
Total Bilirubin: 1.5 mg/dL — ABNORMAL HIGH (ref 0.3–1.2)
Total Bilirubin: 1.2 mg/dL (ref 0.3–1.2)
Total Protein: 7.7 g/dL (ref 6.5–8.1)
Total Protein: 7.1 g/dL (ref 6.5–8.1)

## 2020-01-18 LAB — CBC
HCT: 48.3 % (ref 39.0–52.0)
Hemoglobin: 16.7 g/dL (ref 13.0–17.0)
MCH: 30.9 pg (ref 26.0–34.0)
MCHC: 34.6 g/dL (ref 30.0–36.0)
MCV: 89.3 fL (ref 80.0–100.0)
Platelets: 133 10*3/uL — ABNORMAL LOW (ref 150–400)
RBC: 5.41 MIL/uL (ref 4.22–5.81)
RDW: 12 % (ref 11.5–15.5)
WBC: 3.3 10*3/uL — ABNORMAL LOW (ref 4.0–10.5)
nRBC: 0 % (ref 0.0–0.2)

## 2020-01-18 LAB — RESP PANEL BY RT-PCR (FLU A&B, COVID) ARPGX2
Influenza A by PCR: NEGATIVE
Influenza B by PCR: NEGATIVE
SARS Coronavirus 2 by RT PCR: POSITIVE — AB

## 2020-01-18 LAB — LIPASE, BLOOD
Lipase: 27 U/L (ref 11–51)
Lipase: 26 U/L (ref 11–51)

## 2020-01-18 LAB — TROPONIN I (HIGH SENSITIVITY)
Troponin I (High Sensitivity): 1426 ng/L (ref <18)
Troponin I (High Sensitivity): 20 ng/L — ABNORMAL HIGH (ref <18)
Troponin I (High Sensitivity): 21 ng/L — ABNORMAL HIGH (ref <18)

## 2020-01-18 MED ORDER — DEXAMETHASONE SODIUM PHOSPHATE 10 MG/ML IJ SOLN
10.0000 mg | Freq: Once | INTRAMUSCULAR | Status: AC
  Administered 2020-01-18: 10 mg via INTRAVENOUS
  Filled 2020-01-18: qty 1

## 2020-01-18 MED ORDER — ALBUTEROL SULFATE HFA 108 (90 BASE) MCG/ACT IN AERS: 2.0000 | INHALATION_SPRAY | Freq: Four times a day (QID) | RESPIRATORY_TRACT | 2 refills | Status: AC | PRN

## 2020-01-18 MED ORDER — SODIUM CHLORIDE 0.9 % IV BOLUS
1000.0000 mL | Freq: Once | INTRAVENOUS | Status: AC
  Administered 2020-01-18: 1000 mL via INTRAVENOUS

## 2020-01-18 MED ORDER — SODIUM CHLORIDE 0.9 % IV SOLN
100.0000 mg | Freq: Every day | INTRAVENOUS | Status: DC
  Filled 2020-01-18: qty 20

## 2020-01-18 MED ORDER — PREDNISONE 10 MG (21) PO TBPK: ORAL_TABLET | ORAL | 0 refills | Status: AC

## 2020-01-18 MED ORDER — IOHEXOL 350 MG/ML SOLN
75.0000 mL | Freq: Once | INTRAVENOUS | Status: AC | PRN
  Administered 2020-01-18: 75 mL via INTRAVENOUS

## 2020-01-18 MED ORDER — SODIUM CHLORIDE 0.9 % IV SOLN
200.0000 mg | Freq: Once | INTRAVENOUS | Status: DC
  Filled 2020-01-18: qty 40

## 2020-01-18 MED ORDER — ONDANSETRON HCL 4 MG PO TABS: 4.0000 mg | ORAL_TABLET | Freq: Three times a day (TID) | ORAL | 0 refills | Status: AC | PRN

## 2020-01-18 MED ORDER — ASPIRIN 81 MG PO CHEW
324.0000 mg | CHEWABLE_TABLET | Freq: Once | ORAL | Status: AC
  Administered 2020-01-18: 324 mg via ORAL
  Filled 2020-01-18: qty 4

## 2020-01-18 NOTE — ED Notes (Signed)
Date and time results received: 01/18/20    Test: Troponin  Critical Value: 1,426  Name of Provider Notified: Archie Balboa, MD

## 2020-01-18 NOTE — Progress Notes (Signed)
Remdesivir - Pharmacy Brief Note   O:  CXR: "Bilateral predominantly peripheral nodular ground-glass airspace opacities. Appearance is most suggestive of COVID pneumonia. Small bilateral pleural effusions." SpO2: 91-98% on Room Air.   A/P:  Remdesivir 200 mg IVPB once followed by 100 mg IVPB daily x 4 days.   Renda Rolls, PharmD, [facility] 01/18/2020 9:48 PM

## 2020-01-18 NOTE — ED Provider Notes (Addendum)
[facility] Emergency Department Provider Note    ____________________________________________   I have reviewed the triage vital signs and the nursing notes.   HISTORY  Chief Complaint Diarrhea   History limited by: Not Limited   HPI Brandon Sweeney is a 59 y.o. male who presents to the emergency department today because of concern for diarrhea and weakness. Symptoms started four days ago. He has also had fevers with this. Called his primary care doctor who stated it was a different virus from covid going around, but also told him it could be omicron. The patient denies any shortness of breath but when he tries to take a deep breath he coughs.    Records reviewed. Per medical record review patient has a history of DM, HTN.  Past Medical History:  Diagnosis Date  . Diabetes mellitus without complication ([place])   . Hypertension     There are no problems to display for this patient.   Past Surgical History:  Procedure Laterality Date  . BACK SURGERY  04/30/2017  . COLONOSCOPY WITH PROPOFOL N/A 12/15/2017   Procedure: COLONOSCOPY WITH PROPOFOL;  Surgeon: Manya Silvas, MD;  Location: [facility] ENDOSCOPY;  Service: Endoscopy;  Laterality: N/A;  . EXCISION MASS NECK N/A 06/20/2016   Procedure: EXCISION MASS NECK;  Surgeon: Carloyn Manner, MD;  Location: [facility] ORS;  Service: ENT;  Laterality: N/A;    Prior to Admission medications   Medication Sig Start Date End Date Taking? Authorizing Provider  ACAI BERRY PO Take 2 tablets by mouth daily.    [provider]  amoxicillin-clavulanate (AUGMENTIN) 875-125 MG tablet Take 1 tablet by mouth 2 (two) times daily. Patient not taking: Reported on 03/10/2017 06/20/16   Carloyn Manner, MD  ARGININE PO Take 500 mg by mouth.    [provider]  benazepril (LOTENSIN) 20 MG tablet Take 20 mg by mouth daily.    [provider]  calcium carbonate (TUMS - DOSED IN MG ELEMENTAL CALCIUM) 500 MG  chewable tablet Chew 1 tablet by mouth daily.    [provider]  Cinnamon Bark POWD 1,500 mg by Does not apply route.    [provider]  Flaxseed, Linseed, (FLAXSEED OIL) 1000 MG CAPS Take 1,000 mg by mouth.    [provider]  gentamicin ointment (GARAMYCIN) 0.1 % Apply 1 application topically 3 (three) times daily. Patient not taking: Reported on 07/16/2017 06/20/16   Carloyn Manner, MD  L-ARGININE PO Take 2 tablets by mouth daily.    [provider]  magnesium oxide (MAG-OX) 400 MG tablet Take 400 mg by mouth daily.    [provider]  metFORMIN (GLUCOPHAGE) 500 MG tablet Take by mouth daily with breakfast.    [provider]  Olive Leaf Extract 250 MG CAPS Take 250 mg by mouth.    [provider]  Omega-3 Fatty Acids (FISH OIL) 1000 MG CAPS Take 1 capsule by mouth daily.    [provider]  ondansetron (ZOFRAN) 4 MG tablet Take 1 tablet (4 mg total) by mouth every 8 (eight) hours as needed for nausea or vomiting. Patient not taking: Reported on 03/10/2017 06/20/16   Carloyn Manner, MD  oxyCODONE-acetaminophen (ROXICET) 5-325 MG tablet Take 1-2 tablets by mouth every 4 (four) hours as needed for severe pain. Patient not taking: Reported on 03/10/2017 06/20/16   Carloyn Manner, MD  Red Yeast Rice Extract (RED YEAST RICE PO) Take by mouth daily.    [provider]  TURMERIC PO  Take 1 tablet by mouth daily.    [provider]  vitamin E 400 UNIT capsule Take 400 Units by mouth daily.    [provider]    Allergies Patient has no known allergies.  Family History  Problem Relation Age of Onset  . Colon polyps Mother     Social History Social History   Tobacco Use  . Smoking status: Never Smoker  . Smokeless tobacco: Never Used  Vaping Use  . Vaping Use: Never used  Substance Use Topics  . Alcohol use: Never  . Drug use: Never    Review of Systems Constitutional: Positive for  fever. Eyes: No visual changes. ENT: No sore throat. Cardiovascular: Denies chest pain. Respiratory: Denies shortness of breath. Positive for cough with deep breath. Gastrointestinal: No abdominal pain.  Positive for diarrhea.  Genitourinary: Negative for dysuria. Musculoskeletal: Negative for back pain. Skin: Negative for rash. Neurological: Negative for headaches, focal weakness or numbness.  ____________________________________________   PHYSICAL EXAM:  VITAL SIGNS: ED Triage Vitals  Enc Vitals Group     BP 01/18/20 1658 117/86     Pulse Rate 01/18/20 1658 (!) 124     Resp 01/18/20 1658 16     Temp 01/18/20 1658 99.4 F (37.4 C)     Temp Source 01/18/20 1658 Oral     SpO2 01/18/20 1658 96 %     Weight 01/18/20 1657 233 lb 14.5 oz (106.1 kg)     Height 01/18/20 1657 6' (1.829 m)     Head Circumference --      Peak Flow --      Pain Score 01/18/20 1657 0   Constitutional: Alert and oriented.  Eyes: Conjunctivae are normal.  ENT      Head: Normocephalic and atraumatic.      Nose: No congestion/rhinnorhea.      Mouth/Throat: Mucous membranes are moist.      Neck: No stridor. Hematological/Lymphatic/Immunilogical: No cervical lymphadenopathy. Cardiovascular: Tachycardic, regular rhythm.  No murmurs, rubs, or gallops.  Respiratory: Normal respiratory effort without tachypnea nor retractions. Breath sounds are clear and equal bilaterally. No wheezes/rales/rhonchi. Gastrointestinal: Soft and non tender. No rebound. No guarding.  Genitourinary: Deferred Musculoskeletal: Normal range of motion in all extremities. No lower extremity edema. Neurologic:  Normal speech and language. No gross focal neurologic deficits are appreciated.  Skin:  Skin is warm, dry and intact. No rash noted. Psychiatric: Mood and affect are normal. Speech and behavior are normal. Patient exhibits appropriate insight and judgment.  ____________________________________________    LABS (pertinent  positives/negatives)  CBC wbc 3.3, hgb 16.7, plt 133 UA hazy, ketones 80, rbc and wbc 0-5 CMP na 137, k 4.1, glu 166, cr 1.06 COVID positive ____________________________________________   EKG  I, Nance Pear, attending physician, personally viewed and interpreted this EKG  EKG Time: 1652 Rate: 126 Rhythm: sinus tachycardia Axis: normal Intervals: qtc 428 QRS: narrow, q waves v1, III ST changes: no st elevation Impression: abnormal ekg  ____________________________________________    RADIOLOGY  CT angio No pulmonary embolism. Patchy ground glass opacities.  ____________________________________________   PROCEDURES  Procedures  ____________________________________________   INITIAL IMPRESSION / ASSESSMENT AND PLAN / ED COURSE  Pertinent labs & imaging results that were available during my care of the patient were reviewed by me and considered in my medical decision making (see chart for details).   Patient presented to the emergency department today because of concerns for symptoms consistent with Covid.  Patient has been feeling sick for the  past 4 days.  On exam here patient was not hypoxic.  Blood work however was notable for significantly elevated troponin.  EKG without any ST elevation.  Did obtain a CT angio to evaluate for possible PE causing heart strain.  This did not show any pulmonary embolism.  Discussed with Dr. Johnsie Cancel with cardiology.  At this time did not recommend heparin. Will plan on admission to the hospitalist service. Will treat with remdesivir and steroids.  Repeat troponin 20. This was a significant change and would not be compatable with previous troponin. Because of this did have concern for laboratory error. Obtained a third troponin which was 21. Because of this I do think the initial elevated troponin was erroneous. Patient clinically does not appear to be suffering myocarditis without any chest pain.  I discussed these findings with the  patient.  Did discuss that at this time I feel the first troponin was an error.  Patient was not hypoxic on ambulation.  At this point do not feel he requires admission for his Covid.  Discussed with patient symptomatic treatment of Covid.  Will send information on to monoclonal antibody clinic. ____________________________________________   FINAL CLINICAL IMPRESSION(S) / ED DIAGNOSES  Final diagnoses:  COVID-19     Note: This dictation was prepared with Dragon dictation. Any transcriptional errors that result from this process are unintentional     Nance Pear, MD 01/18/20 2018    Nance Pear, MD 01/18/20 2242

## 2020-01-18 NOTE — ED Triage Notes (Signed)
States PCP said that patient has a different virus that causes weakness.  States feeling weak and also diarrhea x 4 days.  T max 101.9 on Sunday night. Patient is AAOx3.  Skin warm andd ry. NAD

## 2020-01-18 NOTE — Discharge Instructions (Addendum)
Please seek medical attention for any high fevers, chest pain, shortness of breath, change in behavior, persistent vomiting, bloody stool or any other new or concerning symptoms.  

## 2020-01-18 NOTE — ED Notes (Signed)
Pt signed esignature. Iv dc'ed.  D/c inst to pt.  Pt alert.

## 2020-01-20 ENCOUNTER — Telehealth: Payer: Self-pay | Admitting: Family

## 2020-01-20 NOTE — Telephone Encounter (Signed)
Called to Discuss with patient about Covid symptoms and the use of the monoclonal antibody infusion for those with mild to moderate Covid symptoms and at a high risk of hospitalization.     Pt appears to qualify for this infusion due to co-morbid conditions and/or a member of an at-risk group in accordance with the FDA Emergency Use Authorization.    Brandon Sweeney was seen in the ED at [facility] on 12/14 with diarrhea and weakness with symptom onset of 12/10. Tested positive for COVID-19. Qualifying risk factors include diabetes and hypertension.  I was unable to get in touch with Brandon Sweeney via telephone. Left a voicemail with hotline number and MyChart message with additional information.  Terri Piedra, NP 01/20/2020 4:42 PM

## 2020-01-24 ENCOUNTER — Telehealth: Payer: Self-pay | Admitting: Physician Assistant

## 2020-01-24 NOTE — Telephone Encounter (Signed)
Called to discuss with Brandon Sweeney about Covid symptoms and the use of  monoclonal antibody infusion for those with mild to moderate Covid symptoms and at a high risk of hospitalization.     Pt does not qualify for infusion therapy as his symptoms first presented > 7 days prior to timing of infusion. Symptoms tier reviewed as well as criteria for ending isolation. Preventative practices reviewed. Patient verbalized understanding   Leanor Kail, PA-C

## 2020-02-03 IMAGING — CT CT L SPINE W/ CM
1 of 7 series · 6 of 14 positions shown, 8 images · non-contrast
Comparison: None

CLINICAL DATA: Low back pain. LEFT leg pain. Pain radiates to the
LEFT thigh and knee.
TECHNIQUE: Contiguous axial images were obtained through the Lumbar spine after
the intrathecal infusion of infusion. Coronal and sagittal
reconstructions were obtained of the axial image sets.

[Series 3: l spine soft · axial · 0.27mm/px · z∈[-357,-159]mm · 6 of 94 slices shown, 8 images]
[im 14/94  soft-tissue]
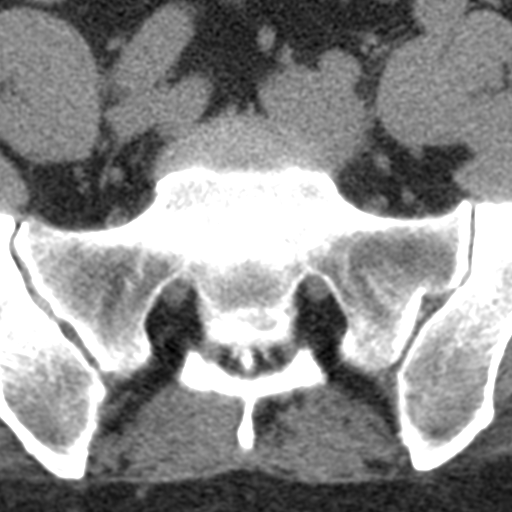
[im 14/94  bone]
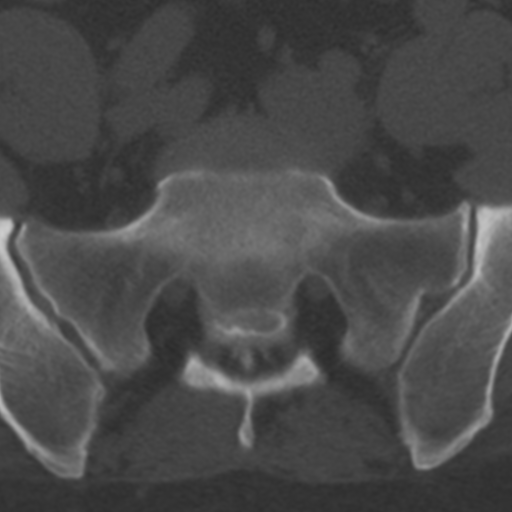
[im 27/94  bone]
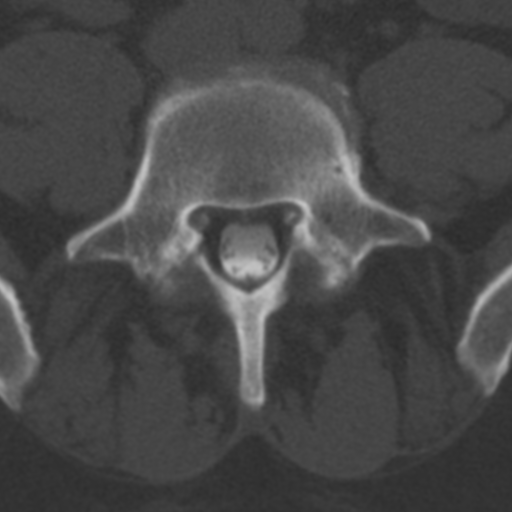
[im 40/94  bone]
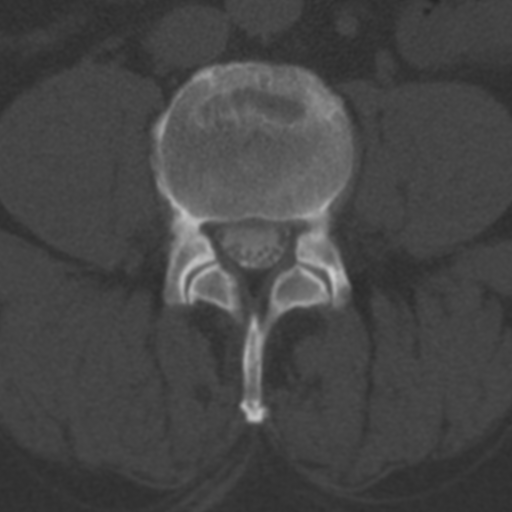
[im 54/94  bone]
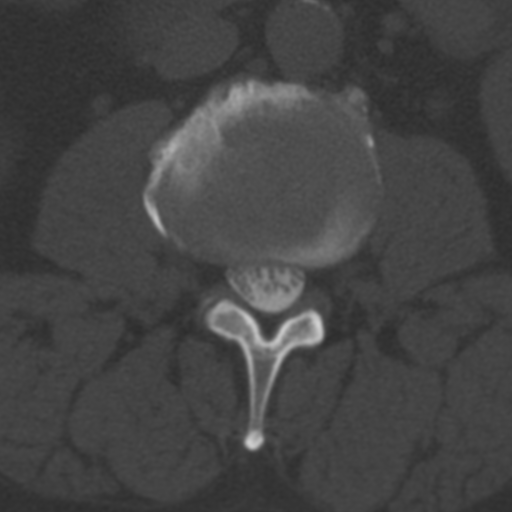
[im 67/94  soft-tissue]
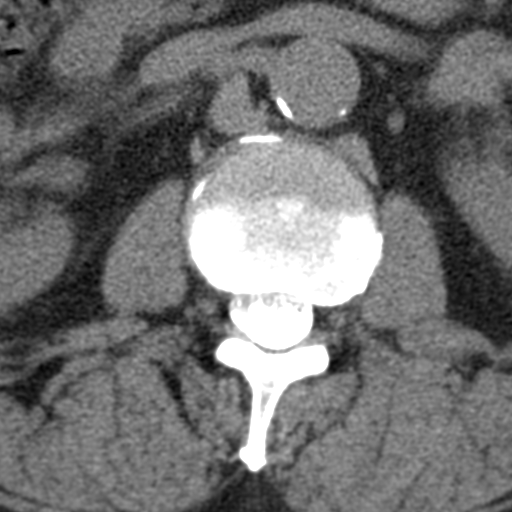
[im 67/94  bone]
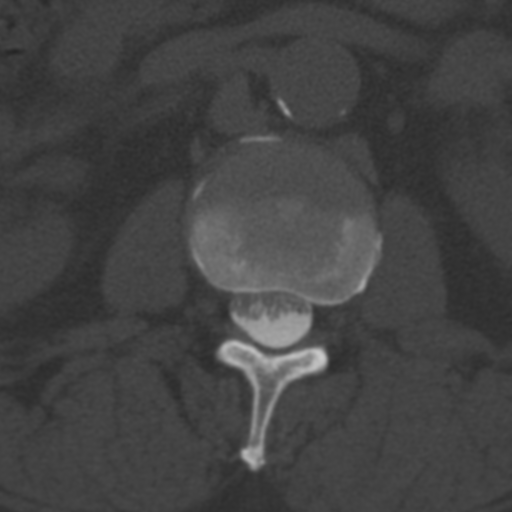
[im 80/94  bone]
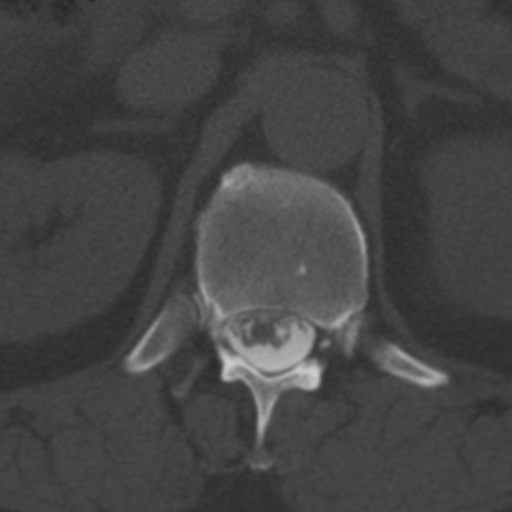

[6 of 14 positions shown; findings below may reference images not displayed]

EXAM:
LUMBAR MYELOGRAM

FLUOROSCOPY TIME:  37 seconds corresponding to a Dose Area Product
of 326.52 ?Gy*m2

PROCEDURE:
After thorough discussion of risks and benefits of the procedure
including bleeding, infection, injury to nerves, blood vessels,
adjacent structures as well as headache and CSF leak, written and
oral informed consent was obtained. Consent was obtained by Dr. Self Made
[place]. Time out form was completed.

Patient was positioned prone on the fluoroscopy table. Local
anesthesia was provided with 1% lidocaine without epinephrine after
prepped and draped in the usual sterile fashion. Puncture was
performed at L3-L4 using a 3 1/2 inch 22-gauge spinal needle via
midline approach. Using a single pass through the dura, the needle
was placed within the thecal sac, with return of clear CSF. 15 mL of
Isovue [id number] was injected into the thecal sac, with normal
opacification of the nerve roots and cauda equina consistent with
free flow within the subarachnoid space.

I personally performed the lumbar puncture and administered the
intrathecal contrast. I also personally supervised acquisition of
the myelogram images.
FINDINGS: LUMBAR MYELOGRAM FINDINGS:

Good opacification lumbar subarachnoid space. Alignment is anatomic.
There is mild effacement of the LEFT L5 nerve root without frank cut
off.

With patient standing, 2 mm anterolisthesis L5 on S1 develops with
patient in neutral alignment. This increases to 3 mm in flexion, and
decreases to 2 mm in extension.

CT LUMBAR MYELOGRAM FINDINGS:

Segmentation: Normal.

Alignment: Normal. Anterolisthesis L5-S1 reduces to anatomic with
patient recumbent for CT

Vertebrae: No worrisome osseous lesion.BILATERAL L5 pars defects

Conus medullaris: Normal in size, signal, and location.

Paraspinal tissues: No evidence for hydronephrosis or paravertebral
mass.

Disc levels:

L1-L2:  Unremarkable.

L2-L3:  Osseous spurring.  No impingement.

L3-L4:  Shallow protrusion.  No impingement.

L4-L5: Central protrusion extends to the LEFT foramen with osseous
spurring. There is a large calcified extraforaminal disc extrusion
on the LEFT, which projects toward the psoas. This disc extrusion is
one of the largest I have ever seen in this location, 10 x 17 mm
cross-section as seen on series 3, image 64. Severe LEFT L4 nerve
root impingement is likely. LEFT L5 nerve root effacement is
secondary.

L5-S1: Fragmentary L5 pars defects, BILATERAL L5 pars defects,
fragmentary on the LEFT. Remarkably, no anterolisthesis. No
subarticular zone or foraminal zone narrowing.
IMPRESSION: LUMBAR MYELOGRAM IMPRESSION:

Mild dynamic instability at L5-S1 due to BILATERAL L5 spondylolysis.
Maximum of 3 mm anterolisthesis with patient standing in flexion.

Shallow effacement of the LEFT L5 nerve root, without truncation of
the nerve root sleeve.

CT LUMBAR MYELOGRAM IMPRESSION:

BILATERAL L5 spondylolysis without grade 1 spondylolisthesis L5-S1.
No L5 or S1 nerve root impingement at the L5-S1 level.

Enormous extraforaminal disc extrusion at L4-5, LEFT, also involving
the foramen. Severe LEFT L4 nerve root impingement is likely.
Subarticular zone narrowing due to disc protrusion affecting the
LEFT L5 nerve root is secondary.

## 2020-02-03 IMAGING — CR DG MYELOGRAPHY LUMBAR INJ LUMBOSACRAL
7 of 19 series · 7 of 19 positions shown · non-contrast
Comparison: None

CLINICAL DATA: Low back pain. LEFT leg pain. Pain radiates to the
LEFT thigh and knee.
TECHNIQUE: Contiguous axial images were obtained through the Lumbar spine after
the intrathecal infusion of infusion. Coronal and sagittal
reconstructions were obtained of the axial image sets.

[w lumbar spine ap]
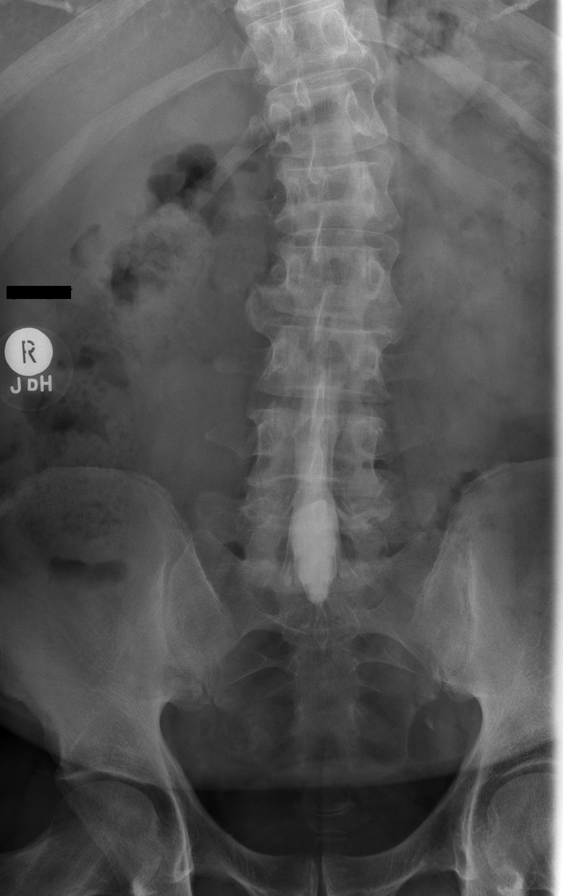

[vasc adipose (1 of 4)]
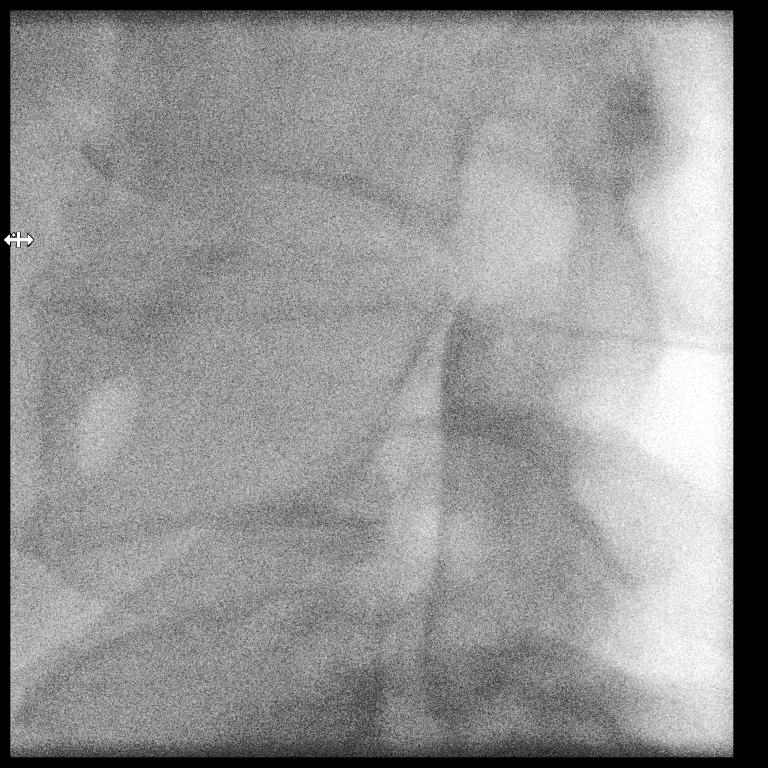

[w lumbar spine lat]
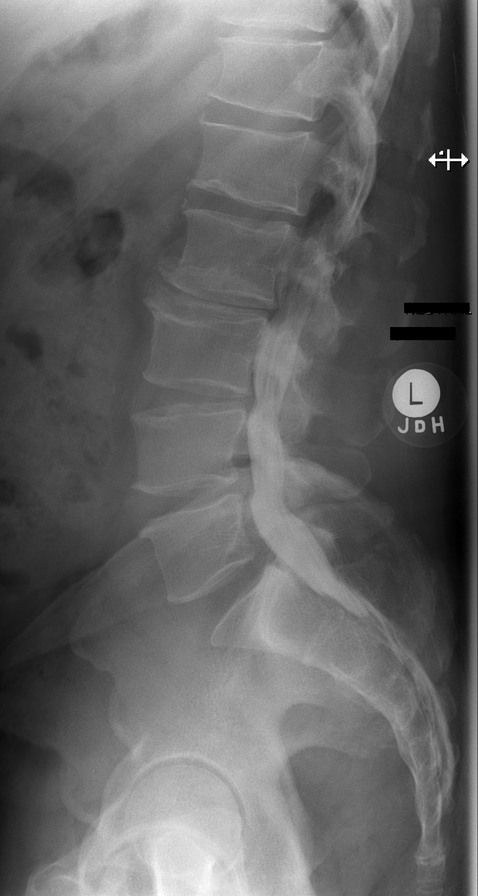

[vasc adipose (2 of 4)]
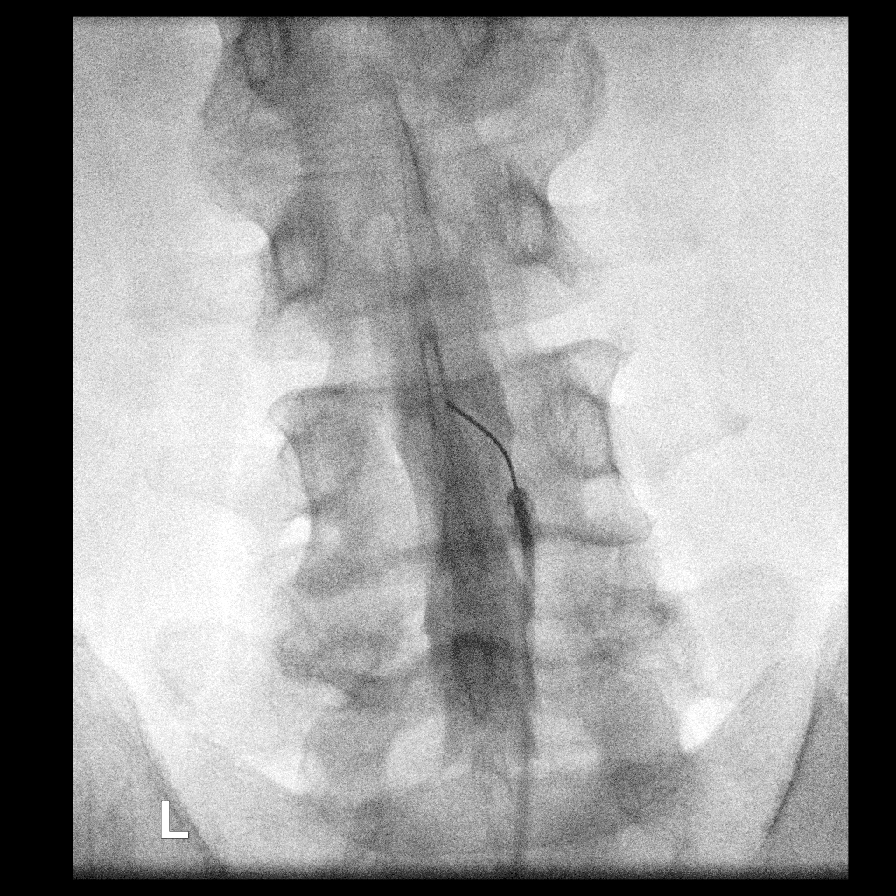

[w lumbar spine flexion]
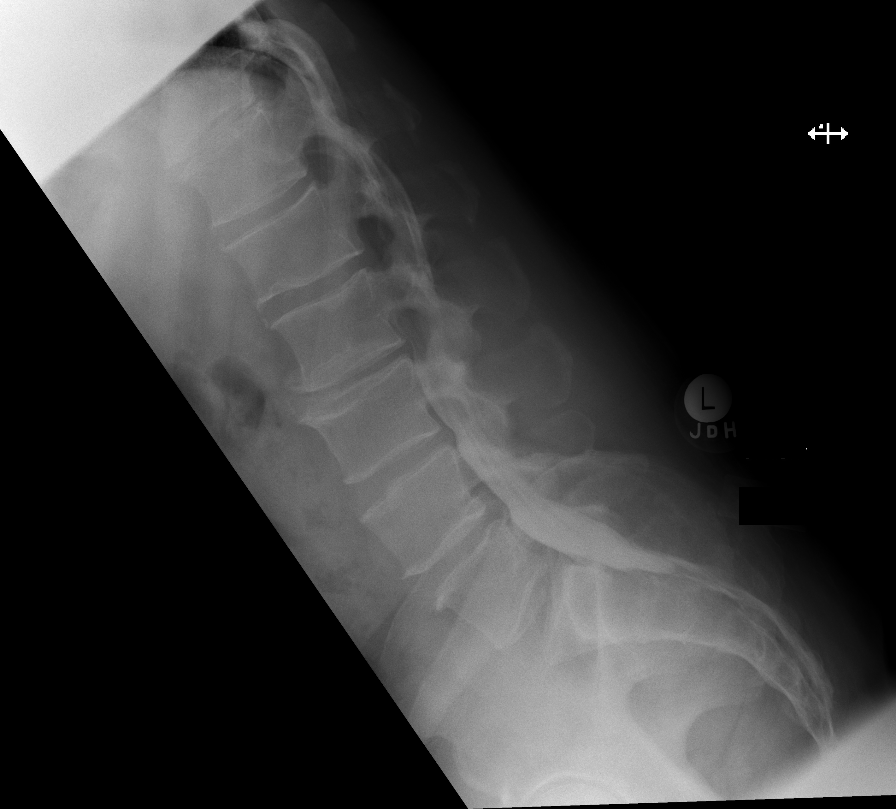

[vasc adipose (3 of 4)]
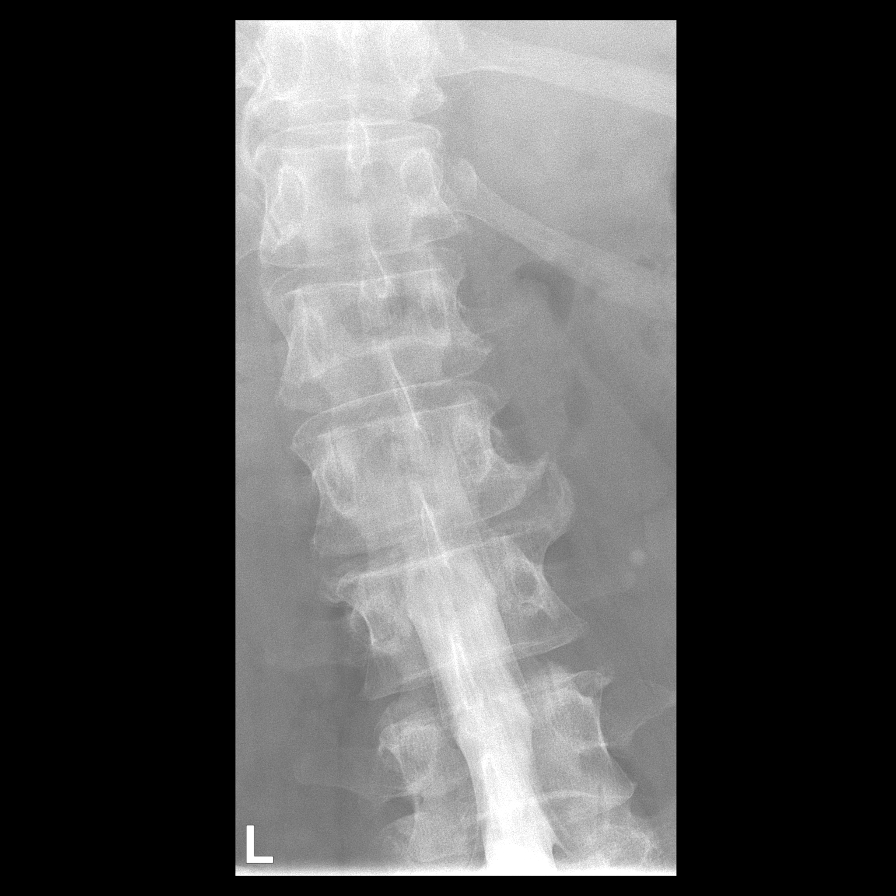

[vasc adipose (4 of 4)]
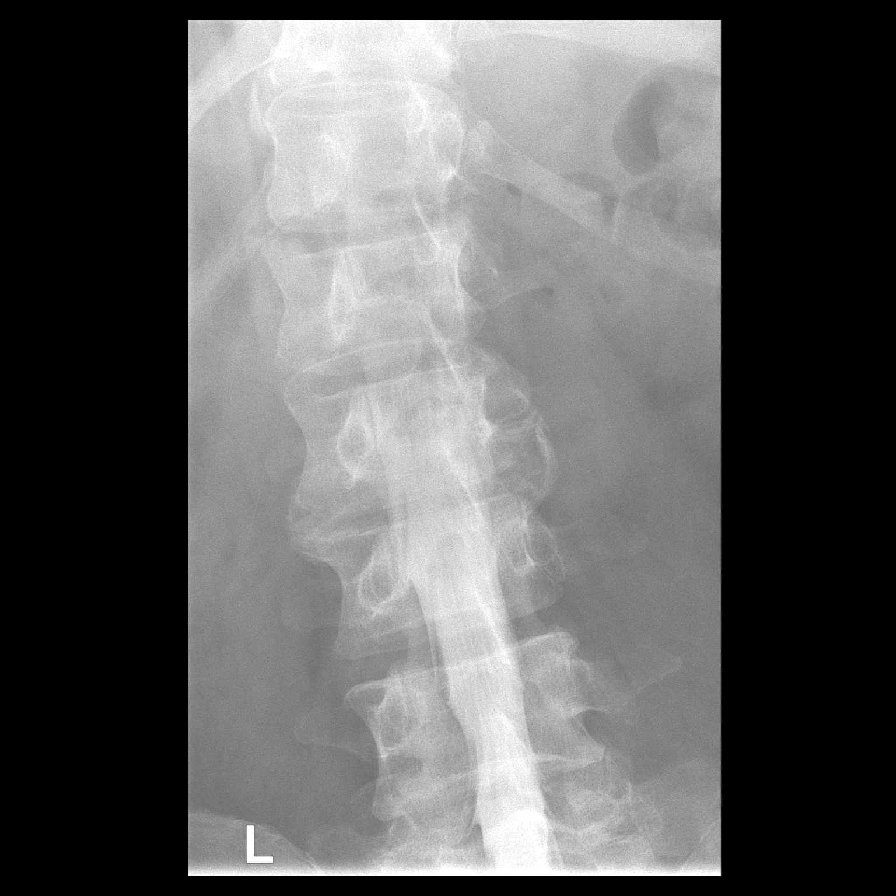

[7 of 19 positions shown; findings below may reference images not displayed]

EXAM:
LUMBAR MYELOGRAM

FLUOROSCOPY TIME:  37 seconds corresponding to a Dose Area Product
of 326.52 ?Gy*m2

PROCEDURE:
After thorough discussion of risks and benefits of the procedure
including bleeding, infection, injury to nerves, blood vessels,
adjacent structures as well as headache and CSF leak, written and
oral informed consent was obtained. Consent was obtained by Dr. Self Made
[place]. Time out form was completed.

Patient was positioned prone on the fluoroscopy table. Local
anesthesia was provided with 1% lidocaine without epinephrine after
prepped and draped in the usual sterile fashion. Puncture was
performed at L3-L4 using a 3 1/2 inch 22-gauge spinal needle via
midline approach. Using a single pass through the dura, the needle
was placed within the thecal sac, with return of clear CSF. 15 mL of
Isovue [id number] was injected into the thecal sac, with normal
opacification of the nerve roots and cauda equina consistent with
free flow within the subarachnoid space.

I personally performed the lumbar puncture and administered the
intrathecal contrast. I also personally supervised acquisition of
the myelogram images.
FINDINGS: LUMBAR MYELOGRAM FINDINGS:

Good opacification lumbar subarachnoid space. Alignment is anatomic.
There is mild effacement of the LEFT L5 nerve root without frank cut
off.

With patient standing, 2 mm anterolisthesis L5 on S1 develops with
patient in neutral alignment. This increases to 3 mm in flexion, and
decreases to 2 mm in extension.

CT LUMBAR MYELOGRAM FINDINGS:

Segmentation: Normal.

Alignment: Normal. Anterolisthesis L5-S1 reduces to anatomic with
patient recumbent for CT

Vertebrae: No worrisome osseous lesion.BILATERAL L5 pars defects

Conus medullaris: Normal in size, signal, and location.

Paraspinal tissues: No evidence for hydronephrosis or paravertebral
mass.

Disc levels:

L1-L2:  Unremarkable.

L2-L3:  Osseous spurring.  No impingement.

L3-L4:  Shallow protrusion.  No impingement.

L4-L5: Central protrusion extends to the LEFT foramen with osseous
spurring. There is a large calcified extraforaminal disc extrusion
on the LEFT, which projects toward the psoas. This disc extrusion is
one of the largest I have ever seen in this location, 10 x 17 mm
cross-section as seen on series 3, image 64. Severe LEFT L4 nerve
root impingement is likely. LEFT L5 nerve root effacement is
secondary.

L5-S1: Fragmentary L5 pars defects, BILATERAL L5 pars defects,
fragmentary on the LEFT. Remarkably, no anterolisthesis. No
subarticular zone or foraminal zone narrowing.
IMPRESSION: LUMBAR MYELOGRAM IMPRESSION:

Mild dynamic instability at L5-S1 due to BILATERAL L5 spondylolysis.
Maximum of 3 mm anterolisthesis with patient standing in flexion.

Shallow effacement of the LEFT L5 nerve root, without truncation of
the nerve root sleeve.

CT LUMBAR MYELOGRAM IMPRESSION:

BILATERAL L5 spondylolysis without grade 1 spondylolisthesis L5-S1.
No L5 or S1 nerve root impingement at the L5-S1 level.

Enormous extraforaminal disc extrusion at L4-5, LEFT, also involving
the foramen. Severe LEFT L4 nerve root impingement is likely.
Subarticular zone narrowing due to disc protrusion affecting the
LEFT L5 nerve root is secondary.

## 2020-03-23 ENCOUNTER — Other Ambulatory Visit: Payer: Self-pay

## 2020-03-23 ENCOUNTER — Ambulatory Visit
Admission: RE | Admit: 2020-03-23 | Discharge: 2020-03-23 | Disposition: A | Discharge status: Home / Self Care | Payer: BC Managed Care – PPO | Source: Ambulatory Visit | Attending: Internal Medicine | Admitting: Internal Medicine

## 2020-03-23 ENCOUNTER — Ambulatory Visit
Admission: RE | Admit: 2020-03-23 | Discharge: 2020-03-23 | Disposition: A | Discharge status: Home / Self Care | Payer: BC Managed Care – PPO | Attending: Internal Medicine | Admitting: Internal Medicine

## 2020-03-23 ENCOUNTER — Other Ambulatory Visit: Payer: Self-pay | Admitting: Internal Medicine

## 2020-03-23 DIAGNOSIS — R0600 Dyspnea, unspecified: Secondary | ICD-10-CM

## 2020-06-06 ENCOUNTER — Encounter: Payer: Self-pay | Admitting: Pulmonary Disease

## 2020-06-06 ENCOUNTER — Other Ambulatory Visit: Payer: Self-pay

## 2020-06-06 ENCOUNTER — Ambulatory Visit (INDEPENDENT_AMBULATORY_CARE_PROVIDER_SITE_OTHER): Payer: BC Managed Care – PPO | Admitting: Pulmonary Disease

## 2020-06-06 VITALS — BP 122/84 | HR 77 | Temp 98.4°F | Ht 72.0 in | Wt 237.2 lb

## 2020-06-06 DIAGNOSIS — R0689 Other abnormalities of breathing: Secondary | ICD-10-CM

## 2020-06-06 DIAGNOSIS — Z8616 Personal history of COVID-19: Secondary | ICD-10-CM | POA: Diagnosis not present

## 2020-06-06 DIAGNOSIS — J8410 Pulmonary fibrosis, unspecified: Secondary | ICD-10-CM | POA: Diagnosis not present

## 2020-06-06 NOTE — Progress Notes (Signed)
Subjective:    Patient ID: Brandon Sweeney, male    DOB: [birth year], 60 y.o.   MRN: [id number]  HPI Patient is a 60 year old lifelong never smoker who presents for evaluation after having had COVID-pneumonia in December 2021.  He is kindly referred by Dr. Benita Stabile.  Patient states he had COVID-pneumonia diagnosed 18 January 2020 and initially treated as an outpatient.  He progressively worsened and had to be admitted at [facility].  He was subsequently transferred to [facility] where he was admitted to the ICU and placed on high flow oxygen.  He was admitted at [facility] from 01/24/2020 through 02/04/18/2022.  He was subsequently discharged on 3 L/min of oxygen since that time, he has increase his activity significantly.  Shortness of breath has improved and he has been weaned off of oxygen.  Occasionally he feels that he has sighing respiration particularly when watching TV.  He does not endorse any dyspnea per se.  On his most recent imaging of the chest was a chest x-ray performed on 17 February there was mild residual scarring from COVID-19.  He does not endorse any orthopnea or paroxysmal nocturnal dyspnea.  No lower extremity edema.  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  He is exposed to dusty environments.  He has no military history.  Review of Systems A 10 point review of systems was performed and it is as noted above otherwise negative.  Past Medical History:  Diagnosis Date  . Diabetes mellitus without complication ([place])   . Hypertension    Past Surgical History:  Procedure Laterality Date  . BACK SURGERY  04/30/2017  . COLONOSCOPY WITH PROPOFOL N/A 12/15/2017   Procedure: COLONOSCOPY WITH PROPOFOL;  Surgeon: Manya Silvas, MD;  Location: [facility] ENDOSCOPY;  Service: Endoscopy;  Laterality: N/A;  . EXCISION MASS NECK N/A 06/20/2016   Procedure: EXCISION MASS NECK;  Surgeon: Carloyn Manner, MD;  Location: [facility] ORS;  Service: ENT;   Laterality: N/A;   Family History  Problem Relation Age of Onset  . Colon polyps Mother    Social History   Tobacco Use  . Smoking status: Never Smoker  . Smokeless tobacco: Never Used  Substance Use Topics  . Alcohol use: Never   No Known Allergies Current Meds  Medication Sig  . ACAI BERRY PO Take 2 tablets by mouth daily.  . ARGININE PO Take 500 mg by mouth.  . benazepril (LOTENSIN) 20 MG tablet Take 20 mg by mouth daily.  . calcium carbonate (TUMS - DOSED IN MG ELEMENTAL CALCIUM) 500 MG chewable tablet Chew 1 tablet by mouth daily.  Verneita Griffes Bark POWD 1,500 mg by Does not apply route.  . Flaxseed, Linseed, (FLAXSEED OIL) 1000 MG CAPS Take 1,000 mg by mouth.  Marland Kitchen gentamicin ointment (GARAMYCIN) 0.1 % Apply 1 application topically 3 (three) times daily.  . L-ARGININE PO Take 2 tablets by mouth daily.  . magnesium oxide (MAG-OX) 400 MG tablet Take 400 mg by mouth daily.  . metFORMIN (GLUCOPHAGE) 500 MG tablet Take by mouth daily with breakfast.  . Olive Leaf Extract 250 MG CAPS Take 250 mg by mouth.  . Omega-3 Fatty Acids (FISH OIL) 1000 MG CAPS Take 1 capsule by mouth daily.  . ondansetron (ZOFRAN) 4 MG tablet Take 1 tablet (4 mg total) by mouth every 8 (eight) hours as needed for nausea or vomiting.  . ondansetron (ZOFRAN) 4 MG tablet Take 1 tablet (4 mg total) by  mouth every 8 (eight) hours as needed.  . predniSONE (STERAPRED UNI-PAK 21 TAB) 10 MG (21) TBPK tablet Per packaging instructions  . Red Yeast Rice Extract (RED YEAST RICE PO) Take by mouth daily.  . TURMERIC PO Take 1 tablet by mouth daily.  . vitamin E 400 UNIT capsule Take 400 Units by mouth daily.   Immunization History  Administered Date(s) Administered  . Tdap 12/22/2019   We discussed Covid-19 precautions.  I reviewed the vaccine effectiveness and potential side effects in detail to include differences between mRNA vaccines and traditional vaccines (attenuated virus).  Discussion also offered of long-term  effectiveness and safety profile which are unclear at this time.  Discussed current CDC guidance that all patients are recommended COVID-19 vaccinations -as long as they do not have allergy to components of the vaccine.    Objective:   Physical Exam BP 122/84 (BP Location: Left Arm, Patient Position: Sitting, Cuff Size: Normal)   Pulse 77   Temp 98.4 F (36.9 C) (Temporal)   Ht 6' (1.829 m)   Wt 237 lb 3.2 oz (107.6 kg)   SpO2 97%   BMI 32.17 kg/m  GENERAL: Well-developed, overweight gentleman, no acute distress.  No conversational dyspnea. HEAD: Normocephalic, atraumatic.  EYES: Pupils equal, round, reactive to light.  No scleral icterus.  MOUTH: Nose/mouth/throat not examined due to masking requirements for COVID 19. NECK: Supple. No thyromegaly. Trachea midline. No JVD.  No adenopathy. PULMONARY: Good air entry bilaterally.  Coarse breath sounds otherwise, no adventitious sounds. CARDIOVASCULAR: S1 and S2. Regular rate and rhythm.  ABDOMEN: Benign. MUSCULOSKELETAL: No joint deformity, no clubbing, no edema.  NEUROLOGIC: No focal deficit, no gait disturbance, speech is fluent. SKIN: Intact,warm,dry.  On limited exam no rashes. PSYCH: Mood and behavior normal.  Chest x-ray performed March 23, 2020 shows some mild residual fibrosis post COVID-19:      Assessment & Plan:     ICD-10-CM   1. 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 Personal history of COVID-19  Z86.16    This adds complexity to his management   After I discussed the plan with the patient of obtaining PFTs he declines PFTs.  He states that he just wanted to make sure there was nothing from the COVID that would give him lung cancer.  I advised him that this is not  something that we can tell at this stage and that the only thing that will show this is follow-up of patients over time.  He then stated that he did not want PFTs nor any further imaging at this time.  We will therefore see him on an as-needed basis.     Renold Don, MD [facility]   *This note was dictated using voice recognition software/Dragon.  Despite best efforts to proofread, errors can occur which can change the meaning.  Any change was purely unintentional.

## 2020-06-06 NOTE — Patient Instructions (Addendum)
Continue using your incentive spirometry several times a day  You declined breathing test that were recommended to you today  We explained there is nothing associated with cancer in the lung, so far with the COVID-19 we are still too early from the beginning of the pandemic to no long-term side effects of this virus  See you on an as-needed basis

## 2020-12-21 ENCOUNTER — Other Ambulatory Visit (HOSPITAL_COMMUNITY): Payer: Self-pay | Admitting: Neurological Surgery

## 2020-12-21 ENCOUNTER — Other Ambulatory Visit: Payer: Self-pay | Admitting: Neurological Surgery

## 2020-12-21 DIAGNOSIS — M542 Cervicalgia: Secondary | ICD-10-CM

## 2020-12-25 ENCOUNTER — Ambulatory Visit: Payer: BC Managed Care – PPO

## 2022-07-11 ENCOUNTER — Ambulatory Visit: Payer: BC Managed Care – PPO

## 2022-07-11 DIAGNOSIS — K5730 Diverticulosis of large intestine without perforation or abscess without bleeding: Secondary | ICD-10-CM

## 2022-07-11 DIAGNOSIS — Z09 Encounter for follow-up examination after completed treatment for conditions other than malignant neoplasm: Secondary | ICD-10-CM | POA: Diagnosis not present

## 2022-07-11 DIAGNOSIS — K640 First degree hemorrhoids: Secondary | ICD-10-CM

## 2022-07-11 DIAGNOSIS — Z86010 Personal history of colonic polyps: Secondary | ICD-10-CM
